# Patient Record
Sex: Female | Born: 1990 | Race: Black or African American | Hispanic: No | Marital: Single | Smoking: Never smoker
Health system: Southern US, Community
[De-identification: ages and names within clinical notes are randomized; demographics above are authoritative.]

## PROBLEM LIST (undated history)

## (undated) ENCOUNTER — Inpatient Hospital Stay (HOSPITAL_COMMUNITY): Payer: Self-pay

## (undated) ENCOUNTER — Emergency Department (HOSPITAL_COMMUNITY): Admission: EM | Payer: Medicaid Other | Source: Home / Self Care

## (undated) ENCOUNTER — Emergency Department (HOSPITAL_COMMUNITY): Admission: EM | Discharge status: Absent without leave | Payer: Medicaid Other

## (undated) DIAGNOSIS — Z789 Other specified health status: Secondary | ICD-10-CM

## (undated) HISTORY — DX: Other specified health status: Z78.9

---

## 2017-04-30 NOTE — L&D Delivery Note (Signed)
Patient is a 27 y.o. now G2P2 s/p NSVD at 288w1d, who was admitted for SOL, TOLAC.  She progressed without augmentation to complete and pushed 33 minutes to deliver.  Cord clamping delayed by several minutes then clamped by CNM and cut by Mother of patient.  Placenta intact and spontaneous, bleeding minimal.  Right sulcus laceration repaired without difficulty.  Mom and baby stable prior to transfer to postpartum. She plans on breastfeeding. She is unsure for birth control.  Delivery Note At 9:12 AM a viable and healthy female was delivered via VBAC, Spontaneous (Presentation:ROA ).  APGAR: 9, 9; weight 5 lb 1 oz (2296 g).   Placenta intact and spontaneous, bleeding minimal. 3V Cord:  with no delivery complications.  Anesthesia: Epidural Episiotomy: None Lacerations: Sulcus Suture Repair: 3.0 vicryl Est. Blood Loss (mL): 150  Mom to postpartum.  Baby to Couplet care / Skin to Skin.  Sharyon CableVeronica C Nyeshia Mysliwiec CNM 08/22/2017, 10:51 AM

## 2017-05-08 LAB — CYTOLOGY - PAP: PAP SMEAR: NEGATIVE

## 2017-05-08 LAB — OB RESULTS CONSOLE RPR: RPR: NONREACTIVE

## 2017-05-08 LAB — OB RESULTS CONSOLE PLATELET COUNT: Platelets: 327

## 2017-05-08 LAB — OB RESULTS CONSOLE HGB/HCT, BLOOD
HCT: 26
HEMOGLOBIN: 8.3

## 2017-05-08 LAB — OB RESULTS CONSOLE HIV ANTIBODY (ROUTINE TESTING): HIV: NONREACTIVE

## 2017-05-08 LAB — OB RESULTS CONSOLE HEPATITIS B SURFACE ANTIGEN: Hepatitis B Surface Ag: NEGATIVE

## 2017-06-23 ENCOUNTER — Encounter (HOSPITAL_COMMUNITY): Payer: Self-pay | Admitting: *Deleted

## 2017-06-23 ENCOUNTER — Other Ambulatory Visit: Payer: Self-pay

## 2017-06-23 ENCOUNTER — Inpatient Hospital Stay (HOSPITAL_COMMUNITY)
Admission: AD | Admit: 2017-06-23 | Discharge: 2017-06-23 | Disposition: A | Discharge status: Home / Self Care | Payer: Self-pay | Source: Ambulatory Visit | Attending: Obstetrics and Gynecology | Admitting: Obstetrics and Gynecology

## 2017-06-23 DIAGNOSIS — Z3A28 28 weeks gestation of pregnancy: Secondary | ICD-10-CM | POA: Insufficient documentation

## 2017-06-23 DIAGNOSIS — M549 Dorsalgia, unspecified: Secondary | ICD-10-CM | POA: Insufficient documentation

## 2017-06-23 DIAGNOSIS — O26893 Other specified pregnancy related conditions, third trimester: Secondary | ICD-10-CM | POA: Insufficient documentation

## 2017-06-23 DIAGNOSIS — R109 Unspecified abdominal pain: Secondary | ICD-10-CM

## 2017-06-23 LAB — RAPID URINE DRUG SCREEN, HOSP PERFORMED
AMPHETAMINES: NOT DETECTED
Barbiturates: NOT DETECTED
Benzodiazepines: NOT DETECTED
Cocaine: NOT DETECTED
OPIATES: NOT DETECTED
Tetrahydrocannabinol: NOT DETECTED

## 2017-06-23 LAB — URINALYSIS, ROUTINE W REFLEX MICROSCOPIC
BILIRUBIN URINE: NEGATIVE
GLUCOSE, UA: NEGATIVE mg/dL
HGB URINE DIPSTICK: NEGATIVE
Ketones, ur: NEGATIVE mg/dL
Nitrite: NEGATIVE
PROTEIN: NEGATIVE mg/dL
Specific Gravity, Urine: 1.009 (ref 1.005–1.030)
pH: 6 (ref 5.0–8.0)

## 2017-06-23 LAB — CBC
HEMATOCRIT: 23.4 % — AB (ref 36.0–46.0)
Hemoglobin: 7.4 g/dL — ABNORMAL LOW (ref 12.0–15.0)
MCH: 21.8 pg — ABNORMAL LOW (ref 26.0–34.0)
MCHC: 31.6 g/dL (ref 30.0–36.0)
MCV: 68.8 fL — AB (ref 78.0–100.0)
PLATELETS: 238 10*3/uL (ref 150–400)
RBC: 3.4 MIL/uL — ABNORMAL LOW (ref 3.87–5.11)
RDW: 17.2 % — AB (ref 11.5–15.5)
WBC: 6.2 10*3/uL (ref 4.0–10.5)

## 2017-06-23 LAB — TYPE AND SCREEN
ABO/RH(D): A POS
Antibody Screen: NEGATIVE

## 2017-06-23 LAB — DIFFERENTIAL
BASOS ABS: 0 10*3/uL (ref 0.0–0.1)
BLASTS: 0 %
Band Neutrophils: 0 %
Basophils Relative: 0 %
Eosinophils Absolute: 0.1 10*3/uL (ref 0.0–0.7)
Eosinophils Relative: 1 %
LYMPHS ABS: 1.7 10*3/uL (ref 0.7–4.0)
LYMPHS PCT: 28 %
MONO ABS: 0.4 10*3/uL (ref 0.1–1.0)
MONOS PCT: 7 %
Metamyelocytes Relative: 0 %
Myelocytes: 0 %
NRBC: 0 /100{WBCs}
Neutro Abs: 4 10*3/uL (ref 1.7–7.7)
Neutrophils Relative %: 64 %
Other: 0 %
PROMYELOCYTES ABS: 0 %

## 2017-06-23 LAB — ABO/RH: ABO/RH(D): A POS

## 2017-06-23 LAB — WET PREP, GENITAL
CLUE CELLS WET PREP: NONE SEEN
SPERM: NONE SEEN
TRICH WET PREP: NONE SEEN
YEAST WET PREP: NONE SEEN

## 2017-06-23 NOTE — MAU Note (Addendum)
Pt presents with c/o lower abdominal cramping and back pain and pressure in her buttocks.  Reports symptoms began 2 hours ago.  Denies VB or LOF.  Reports +FM. Reports recently moved from [place] last seen 3 weeks ago.  No copies of prenatal records.

## 2017-06-23 NOTE — Discharge Instructions (Signed)

## 2017-06-23 NOTE — MAU Note (Signed)
Consent faxed to [facility] in [place], [place] for [place] prenatal records.

## 2017-06-23 NOTE — MAU Provider Note (Signed)
History   G2P1001 @ 28.4 by us from care in [place]. Prev c/s x 1 started having sharp pain 2 hours ago and came in to be evaluated. [place] Denies ROM or vag bleeding. CSN: [id number]  Arrival date & time 06/23/17  1724   None     Chief Complaint  Patient presents with  . Back Pain  . Cramping  . Pressure    HPI  No past medical history on file.   No family history on file.  Social History   Tobacco Use  . Smoking status: Not on file  Substance Use Topics  . Alcohol use: Not on file  . Drug use: Not on file    OB History    Gravida Para Term Preterm AB Living   2 1 1     1    SAB TAB Ectopic Multiple Live Births           1      Review of Systems  Constitutional: Negative.   HENT: Negative.   Eyes: Negative.   Respiratory: Negative.   Cardiovascular: Negative.   Gastrointestinal: Positive for abdominal pain.  Endocrine: Negative.   Genitourinary: Negative.   Musculoskeletal: Negative.   Allergic/Immunologic: Negative.   Neurological: Negative.   Hematological: Negative.   Psychiatric/Behavioral: Negative.     Allergies  Patient has no allergy information on record.  Home Medications    BP 120/68 (BP Location: Right Arm)   Pulse 86   Temp 98.6 F (37 C) (Oral)   Resp 20   Ht 5\' 5"  (1.651 m)   Wt 149 lb 12 oz (67.9 kg)   SpO2 100%   BMI 24.92 kg/m   Physical Exam  Constitutional: She is oriented to person, place, and time. She appears well-developed and well-nourished.  HENT:  Head: Normocephalic.  Eyes: Pupils are equal, round, and reactive to light.  Neck: Normal range of motion.  Cardiovascular: Normal rate, regular rhythm, normal heart sounds and intact distal pulses.  Pulmonary/Chest: Effort normal and breath sounds normal.  Abdominal: Soft. Bowel sounds are normal.  Genitourinary: Vagina normal and uterus normal.  Musculoskeletal: Normal range of motion.  Neurological: She is alert and oriented to person,  place, and time. She has normal reflexes.  Skin: Skin is warm and dry.  Psychiatric: She has a normal mood and affect. Her behavior is normal. Judgment and thought content normal.    MAU Course  Procedures (including critical care time)  Labs Reviewed  URINALYSIS, ROUTINE W REFLEX MICROSCOPIC  RAPID URINE DRUG SCREEN, HOSP PERFORMED   No results found.   No diagnosis found.    MDM  VSS, FHR pattern reassuring. SVE cl/th/post/high. No uc's. Info given to pt to [facility] Prenatal panel and cultures drawn. messeage sent to adm pool to [facility]. Wet prep neg. U/a neg will d/c home

## 2017-06-24 LAB — HEPATITIS B SURFACE ANTIGEN: HEP B S AG: NEGATIVE

## 2017-06-24 LAB — HIV ANTIBODY (ROUTINE TESTING W REFLEX): HIV Screen 4th Generation wRfx: NONREACTIVE

## 2017-06-24 LAB — GC/CHLAMYDIA PROBE AMP (~~LOC~~) NOT AT ARMC
CHLAMYDIA, DNA PROBE: NEGATIVE
NEISSERIA GONORRHEA: NEGATIVE

## 2017-06-24 LAB — RUBELLA SCREEN: RUBELLA: 16.9 {index} (ref >0.99)

## 2017-06-24 LAB — RPR: RPR: NONREACTIVE

## 2017-06-26 ENCOUNTER — Telehealth: Payer: Self-pay | Admitting: General Practice

## 2017-06-26 ENCOUNTER — Encounter: Payer: Self-pay | Admitting: General Practice

## 2017-06-26 NOTE — Telephone Encounter (Signed)
Called patient to inform her of New OB appointment on 07/10/17 at 1:35pm.  Unable to reach patient via phone.  Letter has been mailed to patient.

## 2017-07-10 ENCOUNTER — Ambulatory Visit (INDEPENDENT_AMBULATORY_CARE_PROVIDER_SITE_OTHER): Payer: Self-pay | Admitting: Obstetrics and Gynecology

## 2017-07-10 ENCOUNTER — Encounter: Payer: Self-pay | Admitting: Obstetrics and Gynecology

## 2017-07-10 ENCOUNTER — Telehealth: Payer: Self-pay

## 2017-07-10 DIAGNOSIS — Z98891 History of uterine scar from previous surgery: Secondary | ICD-10-CM | POA: Insufficient documentation

## 2017-07-10 DIAGNOSIS — Z3483 Encounter for supervision of other normal pregnancy, third trimester: Secondary | ICD-10-CM

## 2017-07-10 DIAGNOSIS — Z3480 Encounter for supervision of other normal pregnancy, unspecified trimester: Secondary | ICD-10-CM | POA: Insufficient documentation

## 2017-07-10 NOTE — Patient Instructions (Signed)
Vaginal Birth After Cesarean Delivery Vaginal birth after cesarean delivery (VBAC) is giving birth vaginally after previously delivering a baby by a cesarean. In the past, if a woman had a cesarean delivery, all births afterward would be done by cesarean delivery. This is no longer true. It can be safe for the mother to try a vaginal delivery after having a cesarean delivery. It is important to discuss VBAC with your health care provider early in the pregnancy so you can understand the risks, benefits, and options. It will give you time to decide what is best in your particular case. The final decision about whether to have a VBAC or repeat cesarean delivery should be between you and your health care provider. Any changes in your health or your baby's health during your pregnancy may make it necessary to change your initial decision about VBAC. Women who plan to have a VBAC should check with their health care provider to be sure that:  The previous cesarean delivery was done with a low transverse uterine cut (incision) (not a vertical classical incision).  The birth canal is big enough for the baby.  There were no other operations on the uterus.  An electronic fetal monitor (EFM) will be on at all times during labor.  An operating room will be available and ready in case an emergency cesarean delivery is needed.  A health care provider and surgical nursing staff will be available at all times during labor to be ready to do an emergency delivery cesarean if necessary.  An anesthesiologist will be present in case an emergency cesarean delivery is needed.  The nursery is prepared and has adequate personnel and necessary equipment available to care for the baby in case of an emergency cesarean delivery. Benefits of VBAC  Shorter stay in the hospital.  Avoidance of risks associated with cesarean delivery, such as: ? Surgical complications, such as opening of the incision or hernia in the  incision. ? Injury to other organs. ? Fever. This can occur if an infection develops after surgery. It can also occur as a reaction to the medicine given to make you numb during the surgery.  Less blood loss and need for blood transfusions.  Lower risk of blood clots and infection.  Shorter recovery.  Decreased risk for having to remove the uterus (hysterectomy).  Decreased risk for the placenta to completely or partially cover the opening of the uterus (placenta previa) with a future pregnancy.  Decrease risk in future labor and delivery. Risks of a VBAC  Tearing (rupture) of the uterus. This is occurs in less than 1% of VBACs. The risk of this happening is higher if: ? Steps are taken to begin the labor process (induce labor) or stimulate or strengthen contractions (augment labor). ? Medicine is used to soften (ripen) the cervix.  Having to remove the uterus (hysterectomy) if it ruptures. VBAC should not be done if:  The previous cesarean delivery was done with a vertical (classical) or T-shaped incision or you do not know what kind of incision was made.  You had a ruptured uterus.  You have had certain types of surgery on your uterus, such as removal of uterine fibroids. Ask your health care provider about other types of surgeries that prevent you from having a VBAC.  You have certain medical or childbirth (obstetrical) problems.  There are problems with the baby.  You have had two previous cesarean deliveries and no vaginal deliveries. Other facts to know about VBAC:  It   is safe to have an epidural anesthetic with VBAC.  It is safe to turn the baby from a breech position (attempt an external cephalic version).  It is safe to try a VBAC with twins.  VBAC may not be successful if your baby weights 8.8 lb (4 kg) or more. However, weight predictions are not always accurate and should not be used alone to decide if VBAC is right for you.  There is an increased failure rate  if the time between the cesarean delivery and VBAC is less than 19 months.  Your health care provider may advise against a VBAC if you have preeclampsia (high blood pressure, protein in the urine, and swelling of face and extremities).  VBAC is often successful if you previously gave birth vaginally.  VBAC is often successful when the labor starts spontaneously before the due date.  Delivering a baby through a VBAC is similar to having a normal spontaneous vaginal delivery. This information is not intended to replace advice given to you by your health care provider. Make sure you discuss any questions you have with your health care provider. Document Released: 10/07/2006 Document Revised: 09/22/2015 Document Reviewed: 11/13/2012 Elsevier Interactive Patient Education  2018 Elsevier Inc.  

## 2017-07-10 NOTE — Telephone Encounter (Signed)
Scheduled OB US for 07/17/17 at 07:45. Called patient at home and mobilr.  Unable to leave message.

## 2017-07-10 NOTE — Progress Notes (Signed)
New OB Note  07/10/2017   CC:  Chief Complaint  Patient presents with  . Initial Prenatal Visit    Transfer of Care Patient: yes - care [facility] in [place], [place] (only had 2 prenatal visits)  History of Present Illness: April Schmidt is a 27 y.o. G2P1001 at 4081w0d by anatomy ultrasound being seen today for her first obstetrical visit. Her obstetrical history is significant for none. This was an unplanned pregnancy. Patient does intend to breast feed. Patient is undecided for contraception after completion of pregnancy. Pregnancy history fully reviewed.  Her periods were: regular periods. LMP unknown.  She was using no method when she conceived. Last was on Depo as a teenager.  She has Negative signs or symptoms of nausea/vomiting of pregnancy. She has Negative signs or symptoms of miscarriage or preterm labor  Patient reports backache. No other concerns.   Any prior children are healthy, doing well, without any problems or issues: yes Complications in prior pregnancies: none Complications in prior deliveries: yes, c-section due to breech presentation  ROS: A 12-point review of systems was performed and negative, except as stated in the above HPI.   OBGYN History: OB History  Gravida Para Term Preterm AB Living  2 1 1     1   SAB TAB Ectopic Multiple Live Births          1    # Outcome Date [place] Lbr Len/2nd Weight Sex Delivery Anes PTL Lv  2 Current           1 Term     M CS-Unspec   LIV      GYN Hx: had pap early on this pregnancy - states it was normal  Past Medical History: Past Medical History:  Diagnosis Date  . Medical history non-contributory     Past Surgical History: Past Surgical History:  Procedure Laterality Date  . CESAREAN SECTION     breech presentation    Family History:  Family History  Problem Relation Age of Onset  . Diabetes Maternal Grandmother   . Diabetes Paternal Grandmother     Social History:  Social History    Socioeconomic History  . Marital status: Single    Spouse name: Not on file  . Number of children: Not on file  . Years of education: Not on file  . Highest education level: Not on file  Social Needs  . Financial resource strain: Not on file  . Food insecurity - worry: Not on file  . Food insecurity - inability: Not on file  . Transportation needs - medical: Not on file  . Transportation needs - non-medical: Not on file  Occupational History  . Not on file  Tobacco Use  . Smoking status: Never Smoker  . Smokeless tobacco: Never Used  Substance and Sexual Activity  . Alcohol use: No    Frequency: Never  . Drug use: No  . Sexual activity: No    Birth control/protection: None  Other Topics Concern  . Not on file  Social History Narrative  . Not on file    Allergy: Not on File  Current Outpatient Medications: No current outpatient medications on file.  Physical Exam:   BP 115/65   Pulse 71   Wt 68.4 kg (150 lb 14.4 oz)   LMP  (LMP Unknown)   BMI 25.11 kg/m  Body mass index is 25.11 kg/m. Contractions: Irritability Vag. Bleeding: None. Fundal height: 33 cm FHTs: 140  General  appearance: Well nourished, well developed female in no acute distress.  Neck:  Supple, normal appearance, and no thyromegaly  Cardiovascular: regular rate and rhythm Respiratory:  Clear to auscultation bilateral. Normal respiratory effort Abdomen: positive bowel sounds and no masses, hernias; diffusely non tender to palpation, non distended Breasts: not examined. Neuro/Psych:  Normal mood and affect. Alert Skin:  Warm and dry.  Lymphatic:  No inguinal lymphadenopathy.    Assessment/Plan: G2P1001 [redacted]w[redacted]d  1. Supervision of other normal pregnancy, antepartum -Doing well; continue routine care -records requested for c-section op note and prenatal records -had pap smear in [place] -Unsure if wants to [facility] or [facility]; reassess at next visit. Needs to sign TOLAC consent at next visit;  information to review given -schedule 2 hour GTT this week  Initial labs from MAU reviewed. No additional labs needed at this time.  Continue prenatal vitamins. Genetic Screening discussed, too late for genetic screening.  Ultrasound discussed; fetal anatomic survey: ordered. Will request piror records of [place] as well.  Problem list reviewed and updated. The nature of [facility] with multiple MDs and other Advanced Practice Providers was explained to patient; also emphasized that residents, students are part of our team. Routine obstetric precautions reviewed. Return in about 2 weeks (around 07/24/2017) for ob visit.    Caryl Ada, DO OB Fellow Center for [facility], [facility]

## 2017-07-16 ENCOUNTER — Other Ambulatory Visit: Payer: Self-pay

## 2017-07-17 ENCOUNTER — Ambulatory Visit (HOSPITAL_COMMUNITY)
Admission: RE | Admit: 2017-07-17 | Discharge: 2017-07-17 | Disposition: A | Discharge status: Home / Self Care | Payer: Self-pay | Source: Ambulatory Visit | Attending: Obstetrics and Gynecology | Admitting: Obstetrics and Gynecology

## 2017-07-17 DIAGNOSIS — Z3483 Encounter for supervision of other normal pregnancy, third trimester: Secondary | ICD-10-CM | POA: Insufficient documentation

## 2017-07-17 DIAGNOSIS — Z3A32 32 weeks gestation of pregnancy: Secondary | ICD-10-CM | POA: Insufficient documentation

## 2017-07-17 DIAGNOSIS — Z3480 Encounter for supervision of other normal pregnancy, unspecified trimester: Secondary | ICD-10-CM

## 2017-07-18 NOTE — Telephone Encounter (Signed)
Patient went to her ultrasound appointment on 07-17-17

## 2017-07-22 ENCOUNTER — Other Ambulatory Visit: Payer: Self-pay

## 2017-07-26 ENCOUNTER — Ambulatory Visit (INDEPENDENT_AMBULATORY_CARE_PROVIDER_SITE_OTHER): Payer: Self-pay | Admitting: Nurse Practitioner

## 2017-07-26 VITALS — BP 128/68 | HR 78 | Wt 155.1 lb

## 2017-07-26 DIAGNOSIS — Z98891 History of uterine scar from previous surgery: Secondary | ICD-10-CM

## 2017-07-26 DIAGNOSIS — R102 Pelvic and perineal pain: Secondary | ICD-10-CM

## 2017-07-26 DIAGNOSIS — R519 Headache, unspecified: Secondary | ICD-10-CM

## 2017-07-26 DIAGNOSIS — O26843 Uterine size-date discrepancy, third trimester: Secondary | ICD-10-CM | POA: Insufficient documentation

## 2017-07-26 DIAGNOSIS — O26893 Other specified pregnancy related conditions, third trimester: Secondary | ICD-10-CM

## 2017-07-26 DIAGNOSIS — Z3480 Encounter for supervision of other normal pregnancy, unspecified trimester: Secondary | ICD-10-CM

## 2017-07-26 DIAGNOSIS — R51 Headache: Secondary | ICD-10-CM

## 2017-07-26 NOTE — Progress Notes (Signed)
OB US scheduled for April 11 @ 1345.  Pt notified.

## 2017-07-26 NOTE — Patient Instructions (Addendum)
Use a pregnancy support belt to help relieve the pubic symphysis pain.  Braxton Hicks Contractions Contractions of the uterus can occur throughout pregnancy, but they are not always a sign that you are in labor. You may have practice contractions called Braxton Hicks contractions. These false labor contractions are sometimes confused with true labor. What are April PeltonBraxton Hicks contractions? Braxton Hicks contractions are tightening movements that occur in the muscles of the uterus before labor. Unlike true labor contractions, these contractions do not result in opening (dilation) and thinning of the cervix. Toward the end of pregnancy (32-34 weeks), Braxton Hicks contractions can happen more often and may become stronger. These contractions are sometimes difficult to tell apart from true labor because they can be very uncomfortable. You should not feel embarrassed if you go to the hospital with false labor. Sometimes, the only way to tell if you are in true labor is for your health care provider to look for changes in the cervix. The health care provider will do a physical exam and may monitor your contractions. If you are not in true labor, the exam should show that your cervix is not dilating and your water has not broken. If there are other health problems associated with your pregnancy, it is completely safe for you to be sent home with false labor. You may continue to have Braxton Hicks contractions until you go into true labor. How to tell the difference between true labor and false labor True labor  Contractions last 30-70 seconds.  Contractions become very regular.  Discomfort is usually felt in the top of the uterus, and it spreads to the lower abdomen and low back.  Contractions do not go away with walking.  Contractions usually become more intense and increase in frequency.  The cervix dilates and gets thinner. False labor  Contractions are usually shorter and not as strong as true  labor contractions.  Contractions are usually irregular.  Contractions are often felt in the front of the lower abdomen and in the groin.  Contractions may go away when you walk around or change positions while lying down.  Contractions get weaker and are shorter-lasting as time goes on.  The cervix usually does not dilate or become thin. Follow these instructions at home:  Take over-the-counter and prescription medicines only as told by your health care provider.  Keep up with your usual exercises and follow other instructions from your health care provider.  Eat and drink lightly if you think you are going into labor.  If Braxton Hicks contractions are making you uncomfortable: ? Change your position from lying down or resting to walking, or change from walking to resting. ? Sit and rest in a tub of warm water. ? Drink enough fluid to keep your urine pale yellow. Dehydration may cause these contractions. ? Do slow and deep breathing several times an hour.  Keep all follow-up prenatal visits as told by your health care provider. This is important. Contact a health care provider if:  You have a fever.  You have continuous pain in your abdomen. Get help right away if:  Your contractions become stronger, more regular, and closer together.  You have fluid leaking or gushing from your vagina.  You pass blood-tinged mucus (bloody show).  You have bleeding from your vagina.  You have low back pain that you never had before.  You feel your baby's head pushing down and causing pelvic pressure.  Your baby is not moving inside you as much as  it used to. Summary  Contractions that occur before labor are called Braxton Hicks contractions, false labor, or practice contractions.  Braxton Hicks contractions are usually shorter, weaker, farther apart, and less regular than true labor contractions. True labor contractions usually become progressively stronger and regular and they  become more frequent.  Manage discomfort from [facility] contractions by changing position, resting in a warm bath, drinking plenty of water, or practicing deep breathing. This information is not intended to replace advice given to you by your health care provider. Make sure you discuss any questions you have with your health care provider. Document Released: 08/30/2016 Document Revised: 08/30/2016 Document Reviewed: 08/30/2016 Elsevier Interactive Patient Education  2018 [employer].

## 2017-07-26 NOTE — Progress Notes (Signed)
    Subjective:  April Schmidt is a 27 y.o. G2P1001 at 6281w2d being seen today for ongoing prenatal care.  She is currently monitored for the following issues for this low-risk pregnancy and has Supervision of other normal pregnancy, antepartum; History of C-section; Uterine size date discrepancy pregnancy, third trimester; and Frequent headaches on their problem list.  Patient reports headache, no contractions and pelvic pain with difficulty walking.  Contractions: Irritability. Vag. Bleeding: None.  Movement: Present. Denies leaking of fluid.   The following portions of the patient's history were reviewed and updated as appropriate: allergies, current medications, past family history, past medical history, past social history, past surgical history and problem list. Problem list updated.  Objective:   Vitals:   07/26/17 1349  BP: 128/68  Pulse: 78  Weight: 155 lb 1.6 oz (70.4 kg)    Fetal Status:     Movement: Present     General:  Alert, oriented and cooperative. Patient is in no acute distress.  Skin: Skin is warm and dry. No rash noted.   Cardiovascular: Normal heart rate noted  Respiratory: Normal respiratory effort, no problems with respiration noted  Abdomen: Soft, gravid, appropriate for gestational age. Pain/Pressure: Present   Has pubic symphysis discomfort.  Pelvic:  Cervical exam deferred        Extremities: Normal range of motion.  Edema: None  Mental Status: Normal mood and affect. Normal behavior. Normal judgment and thought content.   Urinalysis:    NA  Assessment and Plan:  Pregnancy: G2P1001 at 5081w2d  1. Supervision of other normal pregnancy, antepartum  - US MFM OB FOLLOW UP; Future  2. History of C-section Previous C/S for breech presentation Needs to discuss delivery plan  3. Uterine size date discrepancy pregnancy, third trimester Not measuring as well today.  Has an ultrasound scheduled as follow up.  Did not discuss US results with client but MFM  concerned re: short femur length and BPD and HC small but do not meet criteria for clinically significant microcephaly Watch fundal height carefully  4. Frequent headaches Taking tylenol for headaches appropriately,   5. Pelvic pain affecting pregnancy in third trimester, antepartum Pubic symphysis discomfort - expect this to continue and improve after delivery. Recommended pregnancy support belt  Preterm labor symptoms and general obstetric precautions including but not limited to vaginal bleeding, contractions, leaking of fluid and fetal movement were reviewed in detail with the patient. Please refer to After Visit Summary for other counseling recommendations.  Return in about 2 weeks (around 08/09/2017).  Form completed to delay training for work and vocation for 90 days.  Nolene BernheimERRI BURLESON, RN, MSN, NP-BC Nurse Practitioner, [facility] for [facility], [facility] 07/26/2017 2:18 PM

## 2017-07-29 ENCOUNTER — Encounter: Payer: Self-pay | Admitting: *Deleted

## 2017-07-31 ENCOUNTER — Encounter: Payer: Self-pay | Admitting: Obstetrics and Gynecology

## 2017-07-31 DIAGNOSIS — O99019 Anemia complicating pregnancy, unspecified trimester: Secondary | ICD-10-CM | POA: Insufficient documentation

## 2017-08-01 ENCOUNTER — Other Ambulatory Visit: Payer: Self-pay | Admitting: Nurse Practitioner

## 2017-08-01 NOTE — Progress Notes (Signed)
CBC from February reviewed.  Severe anemia in pregnancy.  Consult with Dr. Shawnie PonsPratt.  Ordered IV iron.  Will need preauthorization.  Form to the clinic and left for the nurse.  Please notify the client  Nolene BernheimERRI Adrienne Delay, RN, MSN, NP-BC Nurse Practitioner, [facility] for [facility], [facility] 08/01/2017 6:26 PM

## 2017-08-06 ENCOUNTER — Encounter: Payer: Self-pay | Admitting: *Deleted

## 2017-08-07 ENCOUNTER — Telehealth: Payer: Self-pay | Admitting: General Practice

## 2017-08-07 NOTE — Telephone Encounter (Signed)
[facility]18 @ 11am, patient will need to arrive at admitting. Called patient, no answer- unable to leave message, phone just rings then disconnects. Will get MFM to send patient downstairs tomorrow after ultrasound appt for results.

## 2017-08-07 NOTE — Telephone Encounter (Addendum)
-----   Message from Currie Pariserri L Burleson, NP sent at 08/01/2017  6:29 PM EDT -----  Placed an order in her chart for fereheme.  Leaving a signed preauthoization sheet beside the scanned box. Terri

## 2017-08-08 ENCOUNTER — Ambulatory Visit (INDEPENDENT_AMBULATORY_CARE_PROVIDER_SITE_OTHER): Payer: Medicaid Other | Admitting: *Deleted

## 2017-08-08 ENCOUNTER — Ambulatory Visit (HOSPITAL_COMMUNITY)
Admission: RE | Admit: 2017-08-08 | Discharge: 2017-08-08 | Disposition: A | Discharge status: Home / Self Care | Payer: Medicaid Other | Source: Ambulatory Visit | Attending: Nurse Practitioner | Admitting: Nurse Practitioner

## 2017-08-08 DIAGNOSIS — Z3480 Encounter for supervision of other normal pregnancy, unspecified trimester: Secondary | ICD-10-CM

## 2017-08-08 DIAGNOSIS — Z3A35 35 weeks gestation of pregnancy: Secondary | ICD-10-CM | POA: Diagnosis not present

## 2017-08-08 DIAGNOSIS — Z362 Encounter for other antenatal screening follow-up: Secondary | ICD-10-CM | POA: Diagnosis not present

## 2017-08-08 DIAGNOSIS — O0933 Supervision of pregnancy with insufficient antenatal care, third trimester: Secondary | ICD-10-CM | POA: Diagnosis not present

## 2017-08-08 DIAGNOSIS — Z712 Person consulting for explanation of examination or test findings: Secondary | ICD-10-CM

## 2017-08-09 ENCOUNTER — Ambulatory Visit (INDEPENDENT_AMBULATORY_CARE_PROVIDER_SITE_OTHER): Payer: Medicaid Other | Admitting: Nurse Practitioner

## 2017-08-09 VITALS — BP 121/73 | HR 73 | Wt 156.9 lb

## 2017-08-09 DIAGNOSIS — Z3480 Encounter for supervision of other normal pregnancy, unspecified trimester: Secondary | ICD-10-CM

## 2017-08-09 DIAGNOSIS — Z98891 History of uterine scar from previous surgery: Secondary | ICD-10-CM | POA: Diagnosis not present

## 2017-08-09 DIAGNOSIS — Z3483 Encounter for supervision of other normal pregnancy, third trimester: Secondary | ICD-10-CM | POA: Diagnosis not present

## 2017-08-09 NOTE — Progress Notes (Signed)
    Subjective:  April Schmidt is a 27 y.o. G2P1001 at 2443w2d being seen today for ongoing prenatal care.  She is currently monitored for the following issues for this low-risk pregnancy and has Supervision of other normal pregnancy, antepartum; History of C-section; Uterine size date discrepancy pregnancy, third trimester; Frequent headaches; and Anemia on their problem list.  Patient reports no complaints.  Contractions: Irregular. Vag. Bleeding: None.  Movement: Present. Denies leaking of fluid.   The following portions of the patient's history were reviewed and updated as appropriate: allergies, current medications, past family history, past medical history, past social history, past surgical history and problem list. Problem list updated.  Objective:   Vitals:   08/09/17 1514  BP: 121/73  Pulse: 73  Weight: 156 lb 14.4 oz (71.2 kg)    Fetal Status: Fetal Heart Rate (bpm): 142 Fundal Height: 35 cm Movement: Present     General:  Alert, oriented and cooperative. Patient is in no acute distress.  Skin: Skin is warm and dry. No rash noted.   Cardiovascular: Normal heart rate noted  Respiratory: Normal respiratory effort, no problems with respiration noted  Abdomen: Soft, gravid, appropriate for gestational age. Pain/Pressure: Present     Pelvic:  Cervical exam deferred        Extremities: Normal range of motion.  Edema: None  Mental Status: Normal mood and affect. Normal behavior. Normal judgment and thought content.   Urinalysis:      Assessment and Plan:  Pregnancy: G2P1001 at 343w2d  1. Supervision of other normal pregnancy, antepartum and Anemia Discussed contraception and client is unsure.  She was "very careful" for the past 3 years and did not use anything else.  Unsure if she wants another child but would like to wait a few years and decide.  Reviewed that IUD and Nexplanon are available for use.  She did not seem to be interested. Reviewed ultrasound and baby's growth seems  appropriate with no further concerns by MFM. Has appointment this week to get her first iron infusion - client states she will keep the appointment.  2. History of C-section Prenatal records received.  Previous C/S was due to breech position.  ROI for CS Op Notes sent to her previous hospital.  Had client sign VBAC consent also and will cancel if previous uterine incision is not compatible for VBAC.  Preterm labor symptoms and general obstetric precautions including but not limited to vaginal bleeding, contractions, leaking of fluid and fetal movement were reviewed in detail with the patient. Please refer to After Visit Summary for other counseling recommendations.  Return in about 1 week (around 08/16/2017).  Nolene BernheimERRI Tynetta Bachmann, RN, MSN, NP-BC Nurse Practitioner, [facility], [facility] 08/09/2017 4:02 PM

## 2017-08-12 ENCOUNTER — Encounter (HOSPITAL_COMMUNITY): Payer: Self-pay

## 2017-08-12 ENCOUNTER — Inpatient Hospital Stay (HOSPITAL_COMMUNITY)
Admission: AD | Admit: 2017-08-12 | Discharge: 2017-08-12 | Disposition: A | Discharge status: Home / Self Care | Payer: Medicaid Other | Source: Ambulatory Visit | Attending: Family Medicine | Admitting: Family Medicine

## 2017-08-12 DIAGNOSIS — R109 Unspecified abdominal pain: Secondary | ICD-10-CM | POA: Diagnosis present

## 2017-08-12 DIAGNOSIS — O4703 False labor before 37 completed weeks of gestation, third trimester: Secondary | ICD-10-CM

## 2017-08-12 DIAGNOSIS — Z9889 Other specified postprocedural states: Secondary | ICD-10-CM | POA: Diagnosis not present

## 2017-08-12 DIAGNOSIS — Z3A35 35 weeks gestation of pregnancy: Secondary | ICD-10-CM | POA: Insufficient documentation

## 2017-08-12 LAB — URINALYSIS, ROUTINE W REFLEX MICROSCOPIC
Bilirubin Urine: NEGATIVE
GLUCOSE, UA: NEGATIVE mg/dL
HGB URINE DIPSTICK: NEGATIVE
Ketones, ur: NEGATIVE mg/dL
Nitrite: NEGATIVE
PH: 6 (ref 5.0–8.0)
Protein, ur: NEGATIVE mg/dL
Specific Gravity, Urine: 1.009 (ref 1.005–1.030)

## 2017-08-12 LAB — POCT FERN TEST: POCT Fern Test: NEGATIVE

## 2017-08-12 NOTE — MAU Note (Signed)
Tightening and cramping for the past couple hours. Thinks it happens about every 10-15 min. Feels cramps in rectum also. No vaginal bleeding, no abnormal discharge, no LOF. +FM

## 2017-08-12 NOTE — MAU Provider Note (Signed)
History     CSN: [id number]  Arrival date and time: 08/12/17 1215   First Provider Initiated Contact with Patient 08/12/17 1310      Chief Complaint  Patient presents with  . Abdominal Pain  . Contractions   HPI April Schmidt is a 27 y.o. G2P1001 at 6433w5d who presents with contractions. Symptoms began about 2 hours PTA. Reports feeling abdominal pain and tightening every 15+ minutes. Rates pain 8/10 when they occur. Denies vaginal bleeding, dysuria, or LOF. Positive fetal movement. No recent intercourse.   OB History    Gravida  2   Para  1   Term  1   Preterm      AB      Living  1     SAB      TAB      Ectopic      Multiple      Live Births  1           Past Medical History:  Diagnosis Date  . Medical history non-contributory     Past Surgical History:  Procedure Laterality Date  . CESAREAN SECTION     breech presentation    Family History  Problem Relation Age of Onset  . Diabetes Maternal Grandmother   . Diabetes Paternal Grandmother     Social History   Tobacco Use  . Smoking status: Never Smoker  . Smokeless tobacco: Never Used  Substance Use Topics  . Alcohol use: No    Frequency: Never  . Drug use: No    Allergies: No Known Allergies  No medications prior to admission.    Review of Systems  Constitutional: Negative.   Gastrointestinal: Positive for abdominal pain.  Genitourinary: Negative.    Physical Exam   Blood pressure 120/71, pulse 85, temperature 98 F (36.7 C), temperature source Oral, resp. rate 18, height 5\' 5"  (1.651 m), weight 158 lb (71.7 kg).  Physical Exam  Nursing note and vitals reviewed. Constitutional: She is oriented to person, place, and time. She appears well-developed and well-nourished. No distress.  HENT:  Head: Normocephalic and atraumatic.  Eyes: Conjunctivae are normal. Right eye exhibits no discharge. Left eye exhibits no discharge. No scleral icterus.  Neck: Normal range of motion.   Respiratory: Effort normal. No respiratory distress.  GI: Soft. There is no tenderness.  Genitourinary:  Genitourinary Comments: No pooling  Neurological: She is alert and oriented to person, place, and time.  Skin: Skin is warm and dry. She is not diaphoretic.  Psychiatric: She has a normal mood and affect. Her behavior is normal. Judgment and thought content normal.    MAU Course  Procedures Results for orders placed or performed during the hospital encounter of 08/12/17 (from the past 24 hour(s))  Urinalysis, Routine w reflex microscopic     Status: Abnormal   Collection Time: 08/12/17 12:31 PM  Result Value Ref Range   Color, Urine YELLOW YELLOW   APPearance CLEAR CLEAR   Specific Gravity, Urine 1.009 1.005 - 1.030   pH 6.0 5.0 - 8.0   Glucose, UA NEGATIVE NEGATIVE mg/dL   Hgb urine dipstick NEGATIVE NEGATIVE   Bilirubin Urine NEGATIVE NEGATIVE   Ketones, ur NEGATIVE NEGATIVE mg/dL   Protein, ur NEGATIVE NEGATIVE mg/dL   Nitrite NEGATIVE NEGATIVE   Leukocytes, UA MODERATE (A) NEGATIVE   RBC / HPF 0-5 0 - 5 RBC/hpf   WBC, UA 0-5 0 - 5 WBC/hpf   Bacteria, UA RARE (A) NONE SEEN   Squamous  Epithelial / LPF 0-5 (A) NONE SEEN  POCT fern test     Status: None   Collection Time: 08/12/17  4:39 PM  Result Value Ref Range   POCT Fern Test Negative = intact amniotic membranes     MDM NST:  Baseline: 140 bpm, Variability: Good {> 6 bpm), Accelerations: Reactive, Decelerations: Variable: mild and ctx Q3-10 Cervix closed/posterior x 2 exams Some thin discharge during digital exam. SSE performed, no pooling and fern negative  Assessment and Plan  A: 1. Preterm uterine contractions in third trimester, antepartum   2. [redacted] weeks gestation of pregnancy    P: Discharge home Discussed reasons to return to MAU Keep f/u with OB  Judeth Horn 08/12/2017, 1:10 PM

## 2017-08-12 NOTE — Discharge Instructions (Signed)
Braxton Hicks Contractions °Contractions of the uterus can occur throughout pregnancy, but they are not always a sign that you are in labor. You may have practice contractions called Braxton Hicks contractions. These false labor contractions are sometimes confused with true labor. °What are Braxton Hicks contractions? °Braxton Hicks contractions are tightening movements that occur in the muscles of the uterus before labor. Unlike true labor contractions, these contractions do not result in opening (dilation) and thinning of the cervix. Toward the end of pregnancy (32-34 weeks), Braxton Hicks contractions can happen more often and may become stronger. These contractions are sometimes difficult to tell apart from true labor because they can be very uncomfortable. You should not feel embarrassed if you go to the hospital with false labor. °Sometimes, the only way to tell if you are in true labor is for your health care provider to look for changes in the cervix. The health care provider will do a physical exam and may monitor your contractions. If you are not in true labor, the exam should show that your cervix is not dilating and your water has not broken. °If there are other health problems associated with your pregnancy, it is completely safe for you to be sent home with false labor. You may continue to have Braxton Hicks contractions until you go into true labor. °How to tell the difference between true labor and false labor °True labor °· Contractions last 30-70 seconds. °· Contractions become very regular. °· Discomfort is usually felt in the top of the uterus, and it spreads to the lower abdomen and low back. °· Contractions do not go away with walking. °· Contractions usually become more intense and increase in frequency. °· The cervix dilates and gets thinner. °False labor °· Contractions are usually shorter and not as strong as true labor contractions. °· Contractions are usually irregular. °· Contractions  are often felt in the front of the lower abdomen and in the groin. °· Contractions may go away when you walk around or change positions while lying down. °· Contractions get weaker and are shorter-lasting as time goes on. °· The cervix usually does not dilate or become thin. °Follow these instructions at home: °· Take over-the-counter and prescription medicines only as told by your health care provider. °· Keep up with your usual exercises and follow other instructions from your health care provider. °· Eat and drink lightly if you think you are going into labor. °· If Braxton Hicks contractions are making you uncomfortable: °? Change your position from lying down or resting to walking, or change from walking to resting. °? Sit and rest in a tub of warm water. °? Drink enough fluid to keep your urine pale yellow. Dehydration may cause these contractions. °? Do slow and deep breathing several times an hour. °· Keep all follow-up prenatal visits as told by your health care provider. This is important. °Contact a health care provider if: °· You have a fever. °· You have continuous pain in your abdomen. °Get help right away if: °· Your contractions become stronger, more regular, and closer together. °· You have fluid leaking or gushing from your vagina. °· You pass blood-tinged mucus (bloody show). °· You have bleeding from your vagina. °· You have low back pain that you never had before. °· You feel your baby’s head pushing down and causing pelvic pressure. °· Your baby is not moving inside you as much as it used to. °Summary °· Contractions that occur before labor are called Braxton   Hicks contractions, false labor, or practice contractions. °· Braxton Hicks contractions are usually shorter, weaker, farther apart, and less regular than true labor contractions. True labor contractions usually become progressively stronger and regular and they become more frequent. °· Manage discomfort from Braxton Hicks contractions by  changing position, resting in a warm bath, drinking plenty of water, or practicing deep breathing. °This information is not intended to replace advice given to you by your health care provider. Make sure you discuss any questions you have with your health care provider. °Document Released: 08/30/2016 Document Revised: 08/30/2016 Document Reviewed: 08/30/2016 °Elsevier Interactive Patient Education © 2018 [employer] Inc[employer] ° °Fetal Movement Counts °Patient Name: ________________________________________________ Patient Due Date: ____________________ °What is a fetal movement count? °A fetal movement count is the number of times that you feel your baby move during a certain amount of time. This may also be called a fetal kick count. A fetal movement count is recommended for every pregnant woman. You may be asked to start counting fetal movements as early as week 28 of your pregnancy. °Pay attention to when your baby is most active. You may notice your baby's sleep and wake cycles. You may also notice things that make your baby move more. You should do a fetal movement count: °· When your baby is normally most active. °· At the same time each day. ° °A good time to count movements is while you are resting, after having something to eat and drink. °How do I count fetal movements? °1. Find a quiet, comfortable area. Sit, or lie down on your side. °2. Write down the date, the start time and stop time, and the number of movements that you felt between those two times. Take this information with you to your health care visits. °3. For 2 hours, count kicks, flutters, swishes, rolls, and jabs. You should feel at least 10 movements during 2 hours. °4. You may stop counting after you have felt 10 movements. °5. If you do not feel 10 movements in 2 hours, have something to eat and drink. Then, keep resting and counting for 1 hour. If you feel at least 4 movements during that hour, you may stop counting. °Contact a health care  provider if: °· You feel fewer than 4 movements in 2 hours. °· Your baby is not moving like he or she usually does. °Date: ____________ Start time: ____________ Stop time: ____________ Movements: ____________ °Date: ____________ Start time: ____________ Stop time: ____________ Movements: ____________ °Date: ____________ Start time: ____________ Stop time: ____________ Movements: ____________ °Date: ____________ Start time: ____________ Stop time: ____________ Movements: ____________ °Date: ____________ Start time: ____________ Stop time: ____________ Movements: ____________ °Date: ____________ Start time: ____________ Stop time: ____________ Movements: ____________ °Date: ____________ Start time: ____________ Stop time: ____________ Movements: ____________ °Date: ____________ Start time: ____________ Stop time: ____________ Movements: ____________ °Date: ____________ Start time: ____________ Stop time: ____________ Movements: ____________ °This information is not intended to replace advice given to you by your health care provider. Make sure you discuss any questions you have with your health care provider. °Document Released: 05/16/2006 Document Revised: 12/14/2015 Document Reviewed: 05/26/2015 °Elsevier Interactive Patient Education © 2018 Elsevier Inc. ° °

## 2017-08-14 ENCOUNTER — Ambulatory Visit (INDEPENDENT_AMBULATORY_CARE_PROVIDER_SITE_OTHER): Payer: Medicaid Other | Admitting: Family Medicine

## 2017-08-14 ENCOUNTER — Other Ambulatory Visit (HOSPITAL_COMMUNITY)
Admission: RE | Admit: 2017-08-14 | Discharge: 2017-08-14 | Disposition: A | Discharge status: Home / Self Care | Payer: Medicaid Other | Source: Ambulatory Visit | Attending: Family Medicine | Admitting: Family Medicine

## 2017-08-14 VITALS — BP 135/67 | HR 84 | Wt 159.4 lb

## 2017-08-14 DIAGNOSIS — Z98891 History of uterine scar from previous surgery: Secondary | ICD-10-CM | POA: Diagnosis not present

## 2017-08-14 DIAGNOSIS — Z3483 Encounter for supervision of other normal pregnancy, third trimester: Secondary | ICD-10-CM

## 2017-08-14 DIAGNOSIS — O99013 Anemia complicating pregnancy, third trimester: Secondary | ICD-10-CM

## 2017-08-14 DIAGNOSIS — O26843 Uterine size-date discrepancy, third trimester: Secondary | ICD-10-CM | POA: Diagnosis not present

## 2017-08-14 DIAGNOSIS — Z3480 Encounter for supervision of other normal pregnancy, unspecified trimester: Secondary | ICD-10-CM

## 2017-08-14 LAB — OB RESULTS CONSOLE GBS: STREP GROUP B AG: NEGATIVE

## 2017-08-14 LAB — OB RESULTS CONSOLE GC/CHLAMYDIA: GC PROBE AMP, GENITAL: NEGATIVE

## 2017-08-14 NOTE — Progress Notes (Signed)
   PRENATAL VISIT NOTE  Subjective:  April Schmidt is a [age] y.o. G2P1001 at 3328w0d being seen today for ongoing prenatal care.  She is currently monitored for the following issues for this low-risk pregnancy and has Supervision of other normal pregnancy, antepartum; History of C-section; Uterine size date discrepancy pregnancy, third trimester; Frequent headaches; and Anemia on their problem list.  Patient reports no complaints.  Contractions: Irregular. Vag. Bleeding: None.  Movement: Present. Denies leaking of fluid.   The following portions of the patient's history were reviewed and updated as appropriate: allergies, current medications, past family history, past medical history, past social history, past surgical history and problem list. Problem list updated.  Objective:   Vitals:   08/14/17 1629 08/14/17 1634  BP: (!) 145/68 135/67  Pulse: 84 84  Weight: 159 lb 6.4 oz (72.3 kg)     Fetal Status: Fetal Heart Rate (bpm): 154   Movement: Present     General:  Alert, oriented and cooperative. Patient is in no acute distress.  Skin: Skin is warm and dry. No rash noted.   Cardiovascular: Normal heart rate noted  Respiratory: Normal respiratory effort, no problems with respiration noted  Abdomen: Soft, gravid, appropriate for gestational age.  Pain/Pressure: Absent     Pelvic: Cervical exam deferred        Extremities: Normal range of motion.  Edema: None  Mental Status: Normal mood and affect. Normal behavior. Normal judgment and thought content.   Assessment and Plan:  Pregnancy: G2P1001 at 4428w0d  1. Supervision of other normal pregnancy, antepartum - GC/Chlamydia probe amp [facility] - Culture, beta strep (group b only)  2. Uterine size date discrepancy pregnancy, third trimester U/S 4/11 with appropriate growth  3. History of C-section VBAC consent signed last visit  4. Anemia affecting pregnancy Lab Results  Component Value Date   WBC 6.2 06/23/2017   HGB  7.4 (L) 06/23/2017   HCT 23.4 (L) 06/23/2017   MCV 68.8 (L) 06/23/2017   PLT 238 06/23/2017   - Pt getting iron infusion. Next appt tomrrow  Preterm labor symptoms and general obstetric precautions including but not limited to vaginal bleeding, contractions, leaking of fluid and fetal movement were reviewed in detail with the patient. Please refer to After Visit Summary for other counseling recommendations.  Return in about 1 week (around 08/21/2017).  Future Appointments  Date Time Provider Department Center  08/15/2017 11:00 AM [facility] ROOM 3 [facility] None  08/21/2017 10:15 AM Judeth HornLawrence, Erin, NP [facility]  08/22/2017 11:00 AM [facility] ROOM 11 [facility] None  08/28/2017 11:15 AM Jud[facility] HornLawrence, Erin, [facility]  09/04/2017 11:15 AM Marvetta GibbonsBurleson, Brand Maleserri L, NP [facility]  09/10/2017 11:15 AM Pincus LargePhelps, Jazma Y, DO [facility]    Frederik PearJulie P Susann Lawhorne, MD

## 2017-08-14 NOTE — Progress Notes (Signed)
C/o headache =3 earlier, none at present.

## 2017-08-15 ENCOUNTER — Ambulatory Visit (HOSPITAL_COMMUNITY)
Admission: RE | Admit: 2017-08-15 | Discharge: 2017-08-15 | Disposition: A | Discharge status: Home / Self Care | Payer: Medicaid Other | Source: Ambulatory Visit | Attending: Family Medicine | Admitting: Family Medicine

## 2017-08-15 DIAGNOSIS — O99013 Anemia complicating pregnancy, third trimester: Secondary | ICD-10-CM | POA: Diagnosis present

## 2017-08-15 DIAGNOSIS — Z3A37 37 weeks gestation of pregnancy: Secondary | ICD-10-CM | POA: Insufficient documentation

## 2017-08-15 MED ORDER — SODIUM CHLORIDE 0.9 % IV SOLN
510.0000 mg | INTRAVENOUS | Status: DC
  Administered 2017-08-15: 510 mg via INTRAVENOUS
  Filled 2017-08-15: qty 17

## 2017-08-15 NOTE — Discharge Instructions (Signed)

## 2017-08-16 LAB — GC/CHLAMYDIA PROBE AMP (~~LOC~~) NOT AT ARMC
CHLAMYDIA, DNA PROBE: NEGATIVE
NEISSERIA GONORRHEA: NEGATIVE

## 2017-08-19 LAB — CULTURE, BETA STREP (GROUP B ONLY): Strep Gp B Culture: NEGATIVE

## 2017-08-19 NOTE — Progress Notes (Signed)
At time of visit on 4/11, pt was informed of lab results showing low Hgb and need for IV iron infusion. Appt details provided to pt. She has scheduled prenatal visit appt on 4/12.

## 2017-08-21 ENCOUNTER — Inpatient Hospital Stay (HOSPITAL_COMMUNITY): Payer: Medicaid Other

## 2017-08-21 ENCOUNTER — Other Ambulatory Visit: Payer: Self-pay

## 2017-08-21 ENCOUNTER — Ambulatory Visit (INDEPENDENT_AMBULATORY_CARE_PROVIDER_SITE_OTHER): Payer: Medicaid Other | Admitting: Student

## 2017-08-21 ENCOUNTER — Encounter (HOSPITAL_COMMUNITY): Payer: Self-pay

## 2017-08-21 ENCOUNTER — Other Ambulatory Visit (HOSPITAL_COMMUNITY): Payer: Self-pay | Admitting: *Deleted

## 2017-08-21 ENCOUNTER — Inpatient Hospital Stay (HOSPITAL_COMMUNITY)
Admission: AD | Admit: 2017-08-21 | Discharge: 2017-08-24 | DRG: 806 | Disposition: A | Discharge status: Home / Self Care | Payer: Medicaid Other | Source: Ambulatory Visit | Attending: Obstetrics and Gynecology | Admitting: Obstetrics and Gynecology

## 2017-08-21 VITALS — BP 129/63 | HR 80 | Wt 159.0 lb

## 2017-08-21 DIAGNOSIS — Z3480 Encounter for supervision of other normal pregnancy, unspecified trimester: Secondary | ICD-10-CM

## 2017-08-21 DIAGNOSIS — O479 False labor, unspecified: Secondary | ICD-10-CM | POA: Diagnosis present

## 2017-08-21 DIAGNOSIS — O368390 Maternal care for abnormalities of the fetal heart rate or rhythm, unspecified trimester, not applicable or unspecified: Secondary | ICD-10-CM

## 2017-08-21 DIAGNOSIS — Z3483 Encounter for supervision of other normal pregnancy, third trimester: Secondary | ICD-10-CM | POA: Diagnosis not present

## 2017-08-21 DIAGNOSIS — D649 Anemia, unspecified: Secondary | ICD-10-CM | POA: Diagnosis present

## 2017-08-21 DIAGNOSIS — M6208 Separation of muscle (nontraumatic), other site: Secondary | ICD-10-CM | POA: Diagnosis present

## 2017-08-21 DIAGNOSIS — Z3A37 37 weeks gestation of pregnancy: Secondary | ICD-10-CM

## 2017-08-21 DIAGNOSIS — O34219 Maternal care for unspecified type scar from previous cesarean delivery: Principal | ICD-10-CM | POA: Diagnosis present

## 2017-08-21 DIAGNOSIS — K439 Ventral hernia without obstruction or gangrene: Secondary | ICD-10-CM | POA: Diagnosis present

## 2017-08-21 DIAGNOSIS — O9902 Anemia complicating childbirth: Secondary | ICD-10-CM | POA: Diagnosis present

## 2017-08-21 MED ORDER — HYDROMORPHONE HCL 1 MG/ML IJ SOLN
0.5000 mg | Freq: Once | INTRAMUSCULAR | Status: AC
Start: 2017-08-22 — End: 2017-08-22
  Administered 2017-08-22: 0.5 mg via INTRAMUSCULAR
  Filled 2017-08-21: qty 1

## 2017-08-21 MED ORDER — PRENATAL PLUS 27-1 MG PO TABS: 1.0000 | ORAL_TABLET | Freq: Every day | ORAL | 0 refills | Status: DC

## 2017-08-21 NOTE — Progress Notes (Signed)
   PRENATAL VISIT NOTE  Subjective:  April Schmidt is a 27 y.o. G2P1001 at 3623w0d being seen today for ongoing prenatal care.  She is currently monitored for the following issues for this low-risk pregnancy and has Supervision of other normal pregnancy, antepartum; History of C-section; Uterine size date discrepancy pregnancy, third trimester; Frequent headaches; and Anemia affecting pregnancy on their problem list.  Patient reports occasional contractions. Reports run of contractions that occur nightly and last for a few hours.  Contractions: Irregular. Vag. Bleeding: None.  Movement: Present. Denies leaking of fluid.   The following portions of the patient's history were reviewed and updated as appropriate: allergies, current medications, past family history, past medical history, past social history, past surgical history and problem list. Problem list updated.  Objective:   Vitals:   08/21/17 1008  BP: 129/63  Pulse: 80  Weight: 159 lb (72.1 kg)    Fetal Status: Fetal Heart Rate (bpm): 149 Fundal Height: 36 cm Movement: Present  Presentation: Vertex  General:  Alert, oriented and cooperative. Patient is in no acute distress.  Skin: Skin is warm and dry. No rash noted.   Cardiovascular: Normal heart rate noted  Respiratory: Normal respiratory effort, no problems with respiration noted  Abdomen: Soft, gravid, appropriate for gestational age.  Pain/Pressure: Present     Pelvic: Cervical exam performed Dilation: 1.5 Effacement (%): 60 Station: -3  Extremities: Normal range of motion.  Edema: Trace  Mental Status: Normal mood and affect. Normal behavior. Normal judgment and thought content.   Assessment and Plan:  Pregnancy: G2P1001 at 3523w0d  1. Supervision of other normal pregnancy, antepartum -discussed braxton hicks ctx vs labor; s/s of active labor & when to present to MAU - prenatal vitamin w/FE, FA (PRENATAL 1 + 1) 27-1 MG TABS tablet; Take 1 tablet by mouth daily at 12 noon.   Dispense: 30 each; Refill: 0   Term labor symptoms and general obstetric precautions including but not limited to vaginal bleeding, contractions, leaking of fluid and fetal movement were reviewed in detail with the patient. Please refer to After Visit Summary for other counseling recommendations.  Return in about 1 week (around 08/28/2017) for Routine OB.  Future Appointments  Date Time Provider Department Center  08/22/2017 11:00 AM [facility] ROOM 11 [facility] None  08/28/2017 11:15 AM Judeth HornLawrence, Rickayla Wieland, NP [facility]  09/04/2017 11:15 AM Marvetta GibbonsBurleson, Brand Maleserri L, NP [facility]  09/10/2017 11:15 AM Pincus LargePhelps, Jazma Y, DO [facility]    Judeth HornErin Karia Ehresman, NP

## 2017-08-21 NOTE — Patient Instructions (Signed)
Before Baby Comes Home  Before your baby arrives it is important to:   Have all of the supplies that you will need to care for your baby.   Know where to go if there is an emergency.   Discuss the baby's arrival with other family members.    What supplies will I need?    It is recommended that you have the following supplies:  Large Items   Crib.   Crib mattress.   Rear-facing infant car seat. If possible, have a trained professional check to make sure that it is installed correctly.    Feeding   6-8 bottles that are 4-5 oz in size.   6-8 nipples.   Bottle brush.   Sterilizer, or a large pan or kettle with a lid.   A way to boil and cool water.   If you will be breastfeeding:  ? Breast pump.  ? Nipple cream.  ? Nursing bra.  ? Breast pads.  ? Breast shields.   If you will be formula feeding:  ? Formula.  ? Measuring cups.  ? Measuring spoons.    Bathing   Mild baby soap and baby shampoo.   Petroleum jelly.   Soft cloth towel and washcloth.   Hooded towel.   Cotton balls.   Bath basin.    Other Supplies   Rectal thermometer.   Bulb syringe.   Baby wipes or washcloths for diaper changes.   Diaper bag.   Changing pad.   Clothing, including one-piece outfits and pajamas.   Baby nail clippers.   Receiving blankets.   Mattress pad and sheets for the crib.   Night-light for the baby's room.   Baby monitor.   2 or 3 pacifiers.   Either 24-36 cloth diapers and waterproof diaper covers or a box of disposable diapers. You may need to use as many as 10-12 diapers per day.    How do I prepare for an emergency?  Prepare for an emergency by:   Knowing how to get to the nearest hospital.   Listing the phone numbers of your baby's health care providers near your home phone and in your cell phone.    How do I prepare my family?   Decide how to handle visitors.   If you have other children:  ? Talk with them about the baby coming home. Ask them how they feel about it.  ? Read a book together about  being a new big brother or sister.  ? Find ways to let them help you prepare for the new baby.  ? Have someone ready to care for them while you are in the hospital.  This information is not intended to replace advice given to you by your health care provider. Make sure you discuss any questions you have with your health care provider.  Document Released: 03/29/2008 Document Revised: 09/22/2015 Document Reviewed: 03/24/2014  Elsevier Interactive Patient Education  2018 [employer]

## 2017-08-21 NOTE — MAU Provider Note (Signed)
Chief Complaint:  Contractions   First Provider Initiated Contact with Patient 08/21/17 2144     HPI: April Schmidt is a 27 y.o. G2P1001 at 8137w0dwho presents to maternity admissions reporting uterine contractions.  I was asked to see her to review FHR tracing which had some early/variable decels.  We observed her for labor and decels resolved.  Per Dr Despina HiddenEure, would not do BPP if we admit her. . She reports good fetal movement, denies LOF, vaginal bleeding, vaginal itching/burning, urinary symptoms, h/a, dizziness, n/v, diarrhea, constipation or fever/chills.   Her initial exam was remarkable for lax abdominal muscles and extreme tenderness to palpation of upper abdomen.  Dr Despina HiddenEure came and evaluated her and thought it to be related to a large diastasis recti.  He ordered an abdominal binder.  Her UCs have continued and she has made cervical change  Abdominal Pain  This is a new problem. The current episode started today. The problem occurs intermittently. The problem has been unchanged. The pain is located in the LLQ and RLQ. The pain is severe. The quality of the pain is cramping. The abdominal pain does not radiate. Pertinent negatives include no constipation, diarrhea, dysuria, fever, nausea or vomiting. The pain is aggravated by palpation. The pain is relieved by nothing. She has tried nothing for the symptoms. Her past medical history is significant for abdominal surgery (C/S).    RN Note Pt here with contractions. Denies bleeding or leaking. Was checked in the office today and was 1.5cm. Last delivery was C/S for breech; [facility]   Past Medical History: Past Medical History:  Diagnosis Date  . Medical history non-contributory     Past obstetric history: OB History  Gravida Para Term Preterm AB Living  2 1 1     1   SAB TAB Ectopic Multiple Live Births          1    # Outcome Date GA Lbr Len/2nd Weight Sex Delivery Anes PTL Lv  2 Current           1 Term     M CS-Unspec   LIV     Past Surgical History: Past Surgical History:  Procedure Laterality Date  . CESAREAN SECTION     breech presentation    Family History: Family History  Problem Relation Age of Onset  . Diabetes Maternal Grandmother   . Diabetes Paternal Grandmother     Social History: Social History   Tobacco Use  . Smoking status: Never Smoker  . Smokeless tobacco: Never Used  Substance Use Topics  . Alcohol use: No    Frequency: Never  . Drug use: No    Allergies: No Known Allergies  Meds:  Medications Prior to Admission  Medication Sig Dispense Refill Last Dose  . prenatal vitamin w/FE, FA (PRENATAL 1 + 1) 27-1 MG TABS tablet Take 1 tablet by mouth daily at 12 noon. 30 each 0     I have reviewed patient's Past Medical Hx, Surgical Hx, Family Hx, Social Hx, medications and allergies.   ROS:  Review of Systems  Constitutional: Negative for fever.  Gastrointestinal: Positive for abdominal pain. Negative for constipation, diarrhea, nausea and vomiting.  Genitourinary: Negative for dysuria.   Other systems negative  Physical Exam   Patient Vitals for the past 24 hrs:  BP Temp Temp src Pulse Resp SpO2 Height Weight  08/21/17 2055 136/73 - - 80 - - - -  08/21/17 2038 (!) 143/84 97.9 F (36.6 C)  Oral 75 16 100 % - -  08/21/17 2032 - - - - - - 5\' 5"  (1.651 m) 161 lb (73 kg)   Constitutional: Well-developed, well-nourished female in no acute distress.  Cardiovascular: normal rate and rhythm Respiratory: normal effort, clear to auscultation bilaterally GI: Abd soft, non-tender, gravid appropriate for gestational age.   No rebound or guarding. MS: Extremities nontender, no edema, normal ROM Neurologic: Alert and oriented x 4.  GU: Neg CVAT.  PELVIC EXAM:  Dilation: 2.5 Effacement (%): 90, 100 Station: -2 Presentation: Vertex Exam by:: Freddy Finner, RN   New exam  Dilation: 4 Effacement (%): 100 Station: -1 Presentation: Vertex Exam by:: Freddy Finner,  RN  FHT:  Baseline 135 , moderate variability, accelerations present, no decelerations Contractions: q 2-3 mins    Labs: No results found for this or any previous visit (from the past 24 hour(s)).   Imaging:    MAU Course/MDM: I have ordered labs for admission NST reviewed, reactive. Initially had a few variable decels which resolved Consult Dr Despina Hidden with presentation, exam findings and test results.  Treatments in MAU included EFM, Dilaudid 0.5mg  IM x 1 which relieved pain.    Assessment: 1. Variable fetal heart rate decelerations, antepartum   2.     Active Labor 3.     Diastasis Recti  Plan: Admit to BS Routine orders Labor team to follow  Wynelle Bourgeois CNM, MSN Certified Nurse-Midwife 08/21/2017 9:45 PM

## 2017-08-21 NOTE — MAU Note (Signed)
Pt here with contractions. Denies bleeding or leaking. Was checked in the office today and was 1.5cm. Last delivery was C[facility]S for breech; [facility]

## 2017-08-22 ENCOUNTER — Encounter (HOSPITAL_COMMUNITY): Payer: Self-pay | Admitting: *Deleted

## 2017-08-22 ENCOUNTER — Inpatient Hospital Stay (HOSPITAL_COMMUNITY): Payer: Medicaid Other | Admitting: Anesthesiology

## 2017-08-22 ENCOUNTER — Other Ambulatory Visit: Payer: Self-pay

## 2017-08-22 ENCOUNTER — Ambulatory Visit (HOSPITAL_COMMUNITY): Admission: RE | Admit: 2017-08-22 | Payer: Medicaid Other | Source: Ambulatory Visit

## 2017-08-22 DIAGNOSIS — M6208 Separation of muscle (nontraumatic), other site: Secondary | ICD-10-CM | POA: Diagnosis present

## 2017-08-22 DIAGNOSIS — Z3A37 37 weeks gestation of pregnancy: Secondary | ICD-10-CM

## 2017-08-22 DIAGNOSIS — Z3483 Encounter for supervision of other normal pregnancy, third trimester: Secondary | ICD-10-CM | POA: Diagnosis present

## 2017-08-22 DIAGNOSIS — O479 False labor, unspecified: Secondary | ICD-10-CM | POA: Diagnosis present

## 2017-08-22 DIAGNOSIS — K439 Ventral hernia without obstruction or gangrene: Secondary | ICD-10-CM | POA: Diagnosis present

## 2017-08-22 DIAGNOSIS — D649 Anemia, unspecified: Secondary | ICD-10-CM | POA: Diagnosis present

## 2017-08-22 DIAGNOSIS — O9902 Anemia complicating childbirth: Secondary | ICD-10-CM | POA: Diagnosis present

## 2017-08-22 DIAGNOSIS — O34219 Maternal care for unspecified type scar from previous cesarean delivery: Secondary | ICD-10-CM | POA: Diagnosis not present

## 2017-08-22 LAB — CBC
HCT: 27.9 % — ABNORMAL LOW (ref 36.0–46.0)
HEMOGLOBIN: 8.5 g/dL — AB (ref 12.0–15.0)
MCH: 21.6 pg — ABNORMAL LOW (ref 26.0–34.0)
MCHC: 30.5 g/dL (ref 30.0–36.0)
MCV: 70.8 fL — ABNORMAL LOW (ref 78.0–100.0)
PLATELETS: 248 10*3/uL (ref 150–400)
RBC: 3.94 MIL/uL (ref 3.87–5.11)
RDW: 24.5 % — ABNORMAL HIGH (ref 11.5–15.5)
WBC: 9.8 10*3/uL (ref 4.0–10.5)

## 2017-08-22 LAB — TYPE AND SCREEN
ABO/RH(D): A POS
ANTIBODY SCREEN: NEGATIVE

## 2017-08-22 LAB — RPR: RPR: NONREACTIVE

## 2017-08-22 LAB — HIV ANTIBODY (ROUTINE TESTING W REFLEX): HIV Screen 4th Generation wRfx: NONREACTIVE

## 2017-08-22 MED ORDER — BENZOCAINE-MENTHOL 20-0.5 % EX AERO
1.0000 "application " | INHALATION_SPRAY | CUTANEOUS | Status: DC | PRN
  Administered 2017-08-23: 1 via TOPICAL
  Filled 2017-08-22: qty 56

## 2017-08-22 MED ORDER — SENNOSIDES-DOCUSATE SODIUM 8.6-50 MG PO TABS
2.0000 | ORAL_TABLET | ORAL | Status: DC
Start: 2017-08-23 — End: 2017-08-24
  Administered 2017-08-23 (×2): 2 via ORAL
  Filled 2017-08-22 (×2): qty 2

## 2017-08-22 MED ORDER — DIPHENHYDRAMINE HCL 25 MG PO CAPS: 25.0000 mg | ORAL_CAPSULE | Freq: Four times a day (QID) | ORAL | Status: DC | PRN

## 2017-08-22 MED ORDER — LIDOCAINE HCL (PF) 1 % IJ SOLN
30.0000 mL | INTRAMUSCULAR | Status: DC | PRN
  Filled 2017-08-22: qty 30

## 2017-08-22 MED ORDER — WITCH HAZEL-GLYCERIN EX PADS: 1.0000 "application " | MEDICATED_PAD | CUTANEOUS | Status: DC | PRN

## 2017-08-22 MED ORDER — LACTATED RINGERS IV SOLN: 500.0000 mL | INTRAVENOUS | Status: DC | PRN

## 2017-08-22 MED ORDER — OXYCODONE-ACETAMINOPHEN 5-325 MG PO TABS: 2.0000 | ORAL_TABLET | ORAL | Status: DC | PRN

## 2017-08-22 MED ORDER — IBUPROFEN 600 MG PO TABS
600.0000 mg | ORAL_TABLET | Freq: Four times a day (QID) | ORAL | Status: DC
  Administered 2017-08-22 – 2017-08-24 (×10): 600 mg via ORAL
  Filled 2017-08-22 (×10): qty 1

## 2017-08-22 MED ORDER — LACTATED RINGERS IV SOLN: 500.0000 mL | Freq: Once | INTRAVENOUS | Status: DC

## 2017-08-22 MED ORDER — COCONUT OIL OIL
1.0000 | TOPICAL_OIL | Status: DC | PRN
Start: 2017-08-22 — End: 2017-08-24

## 2017-08-22 MED ORDER — EPHEDRINE 5 MG/ML INJ
10.0000 mg | INTRAVENOUS | Status: DC | PRN
  Filled 2017-08-22: qty 2

## 2017-08-22 MED ORDER — ACETAMINOPHEN 325 MG PO TABS: 650.0000 mg | ORAL_TABLET | ORAL | Status: DC | PRN

## 2017-08-22 MED ORDER — FENTANYL CITRATE (PF) 100 MCG/2ML IJ SOLN
100.0000 ug | INTRAMUSCULAR | Status: DC | PRN
  Administered 2017-08-22: 100 ug via INTRAVENOUS
  Filled 2017-08-22: qty 2

## 2017-08-22 MED ORDER — PRENATAL MULTIVITAMIN CH
1.0000 | ORAL_TABLET | Freq: Every day | ORAL | Status: DC
  Administered 2017-08-23 – 2017-08-24 (×2): 1 via ORAL
  Filled 2017-08-22 (×2): qty 1

## 2017-08-22 MED ORDER — OXYCODONE-ACETAMINOPHEN 5-325 MG PO TABS: 1.0000 | ORAL_TABLET | ORAL | Status: DC | PRN

## 2017-08-22 MED ORDER — SOD CITRATE-CITRIC ACID 500-334 MG/5ML PO SOLN: 30.0000 mL | ORAL | Status: DC | PRN

## 2017-08-22 MED ORDER — LIDOCAINE HCL (PF) 1 % IJ SOLN
INTRAMUSCULAR | Status: DC | PRN
  Administered 2017-08-22: 6 mL
  Administered 2017-08-22: 4 mL

## 2017-08-22 MED ORDER — LACTATED RINGERS IV SOLN
INTRAVENOUS | Status: DC
  Administered 2017-08-22 (×2): via INTRAVENOUS

## 2017-08-22 MED ORDER — ONDANSETRON HCL 4 MG/2ML IJ SOLN: 4.0000 mg | INTRAMUSCULAR | Status: DC | PRN

## 2017-08-22 MED ORDER — OXYTOCIN 40 UNITS IN LACTATED RINGERS INFUSION - SIMPLE MED
2.5000 [IU]/h | INTRAVENOUS | Status: DC
  Filled 2017-08-22: qty 1000

## 2017-08-22 MED ORDER — ONDANSETRON HCL 4 MG PO TABS: 4.0000 mg | ORAL_TABLET | ORAL | Status: DC | PRN

## 2017-08-22 MED ORDER — TETANUS-DIPHTH-ACELL PERTUSSIS 5-2.5-18.5 LF-MCG/0.5 IM SUSP: 0.5000 mL | Freq: Once | INTRAMUSCULAR | Status: DC

## 2017-08-22 MED ORDER — FENTANYL 2.5 MCG/ML BUPIVACAINE 1/10 % EPIDURAL INFUSION (WH - ANES)
14.0000 mL/h | INTRAMUSCULAR | Status: DC | PRN
  Administered 2017-08-22: 14 mL/h via EPIDURAL
  Filled 2017-08-22: qty 100

## 2017-08-22 MED ORDER — OXYTOCIN BOLUS FROM INFUSION
500.0000 mL | Freq: Once | INTRAVENOUS | Status: AC
  Administered 2017-08-22: 500 mL via INTRAVENOUS

## 2017-08-22 MED ORDER — PHENYLEPHRINE 40 MCG/ML (10ML) SYRINGE FOR IV PUSH (FOR BLOOD PRESSURE SUPPORT)
80.0000 ug | PREFILLED_SYRINGE | INTRAVENOUS | Status: DC | PRN
  Filled 2017-08-22: qty 5
  Filled 2017-08-22: qty 10

## 2017-08-22 MED ORDER — ZOLPIDEM TARTRATE 5 MG PO TABS: 5.0000 mg | ORAL_TABLET | Freq: Every evening | ORAL | Status: DC | PRN

## 2017-08-22 MED ORDER — DIBUCAINE 1 % RE OINT: 1.0000 "application " | TOPICAL_OINTMENT | RECTAL | Status: DC | PRN

## 2017-08-22 MED ORDER — DIPHENHYDRAMINE HCL 50 MG/ML IJ SOLN: 12.5000 mg | INTRAMUSCULAR | Status: DC | PRN

## 2017-08-22 MED ORDER — PHENYLEPHRINE 40 MCG/ML (10ML) SYRINGE FOR IV PUSH (FOR BLOOD PRESSURE SUPPORT)
80.0000 ug | PREFILLED_SYRINGE | INTRAVENOUS | Status: DC | PRN
  Filled 2017-08-22: qty 5

## 2017-08-22 MED ORDER — SIMETHICONE 80 MG PO CHEW
80.0000 mg | CHEWABLE_TABLET | ORAL | Status: DC | PRN
Start: 2017-08-22 — End: 2017-08-24

## 2017-08-22 MED ORDER — ONDANSETRON HCL 4 MG/2ML IJ SOLN
4.0000 mg | Freq: Four times a day (QID) | INTRAMUSCULAR | Status: DC | PRN
  Administered 2017-08-22: 4 mg via INTRAVENOUS
  Filled 2017-08-22: qty 2

## 2017-08-22 NOTE — Lactation Note (Addendum)
This note was copied from a baby's chart. Lactation Consultation Note  Patient Name: April Schmidt ZOXWR'UToday's Date: 08/22/2017 Reason for consult: Initial assessment;Early term 37-38.6wks;Infant < 6lbs  11 hours old early term < 6 pounds female who is being exclusively BF by his mother, she's a P2 and experienced BF, she was able to BF her first child for 12 months. RN set up mom with a DEPB due to difficult latch, and mom is already getting volume, she was able to pump nearly an ounce of colostrum from just one breast. Offered assistance with latch and took baby to the right breast STS in cross cradle position but baby wouldn't latch, he kept pushing away from the breast.  LC did some suck training and baby's pattern was very uncoordinated, it took at least 2 couple of minutes to get him into a rhythmical pattern before putting him back at the breast; he still wouldn't latch. Asked mom if she would like to try a NS and she voiced that those worked "wonderfully" with her first baby. Got mom sized on a # 20 NS and baby latched on right away but more suck training was required in order to elicit the sucking reflex. Baby sucked for about 5 minutes but no colostrum was seen on NS at the end of the feeding. Left a # 24 NS in case mom's nipples get bigger once lactogenesis II sets in, the # 20 seems to fit just right for now.  Advised mom to give baby some calories with her EBM and baby took 5 ml during consult with LC doing a combination of pace feeding and finger feeding and mom also doing pace feeding. Mom still has colostrum for the next feeding, recommended mom to keep pumping every 3 hours now that she's on a NS to protect her supply. She verbalized understanding. Discussed breastmilk storage, she'll start handing her EBM to her nurse if needed.  Encouraged mom to keep putting baby at the breast with a NS at least 8-12 times/24 hours or sooner if feeding cues are present. Mom will also feed baby her EBM  every 3 hours and will pump after feedings. Reviewed BF brochure, BF resources and feeding diary, mom is aware of LC services and will call PRN.  Maternal Data Formula Feeding for Exclusion: No Has patient been taught Hand Expression?: Yes Does the patient have breastfeeding experience prior to this delivery?: Yes  Feeding Feeding Type: Breast Fed Nipple Type: Slow - flow Length of feed: 5 min  LATCH Score Latch: Grasps breast easily, tongue down, lips flanged, rhythmical sucking.(with NS # 20)  Audible Swallowing: None  Type of Nipple: Everted at rest and after stimulation(short shafted)  Comfort (Breast/Nipple): Soft / non-tender  Hold (Positioning): Assistance needed to correctly position infant at breast and maintain latch.  LATCH Score: 7  Interventions Interventions: Breast feeding basics reviewed;Assisted with latch;Skin to skin;Breast massage;Hand express;Breast compression;Adjust position;Support pillows;Position options;Expressed milk;Hand pump;DEBP  Lactation Tools Discussed/Used Tools: Nipple Dorris CarnesShields;Pump Nipple shield size: 20 Breast pump type: Double-Electric Breast Pump;Manual WIC Program: No Pump Review: Setup, frequency, and cleaning;Milk Storage Initiated by:: RN Date initiated:: 08/22/17   Consult Status Consult Status: Follow-up Date: 08/23/17 Follow-up type: In-patient    Kearsten Ginther Venetia ConstableS Nupur Hohman 08/22/2017, 8:46 PM

## 2017-08-22 NOTE — Anesthesia Preprocedure Evaluation (Signed)
Anesthesia Evaluation  Patient identified by MRN, date of birth, ID band Patient awake    Reviewed: Allergy & Precautions, H&P , Patient's Chart, lab work & pertinent test results  Airway Mallampati: II  TM Distance: >3 FB Neck ROM: full    Dental  (+) Teeth Intact   Pulmonary    breath sounds clear to auscultation       Cardiovascular  Rhythm:regular Rate:Normal     Neuro/Psych    GI/Hepatic   Endo/Other    Renal/GU      Musculoskeletal   Abdominal   Peds  Hematology  (+) anemia ,   Anesthesia Other Findings       Reproductive/Obstetrics (+) Pregnancy                             Anesthesia Physical Anesthesia Plan  ASA: II  Anesthesia Plan: Epidural   Post-op Pain Management:    Induction:   PONV Risk Score and Plan:   Airway Management Planned:   Additional Equipment:   Intra-op Plan:   Post-operative Plan:   Informed Consent: I have reviewed the patients History and Physical, chart, labs and discussed the procedure including the risks, benefits and alternatives for the proposed anesthesia with the patient or authorized representative who has indicated his/her understanding and acceptance.   Dental Advisory Given  Plan Discussed with:   Anesthesia Plan Comments: (Labs checked- platelets confirmed with RN in room. Fetal heart tracing, per RN, reported to be stable enough for sitting procedure. Discussed epidural, and patient consents to the procedure:  included risk of possible headache,backache, failed block, allergic reaction, and nerve injury. This patient was asked if she had any questions or concerns before the procedure started.)        Anesthesia Quick Evaluation  

## 2017-08-22 NOTE — Anesthesia Procedure Notes (Signed)
Epidural Patient location during procedure: OB  Staffing Anesthesiologist: Erasmus Bistline, MD  Preanesthetic Checklist Completed: patient identified, pre-op evaluation, timeout performed, IV checked, risks and benefits discussed and monitors and equipment checked  Epidural Patient position: sitting Prep: DuraPrep Patient monitoring: blood pressure and continuous pulse ox Approach: right paramedian Location: L3-L4 Injection technique: LOR air  Needle:  Needle type: Tuohy  Needle gauge: 17 G Needle insertion depth: 5 cm Catheter type: closed end flexible Catheter size: 19 Gauge Catheter at skin depth: 10 cm Test dose: negative  Assessment Sensory level: T8  Additional Notes   Dosing of Epidural:  1st dose, through catheter .............................................  Xylocaine 40 mg  2nd dose, through catheter, after waiting 3 minutes.........Xylocaine 60 mg    As each dose occurred, patient was free of IV sx; and patient exhibited no evidence of SA injection.  Patient is more comfortable after epidural dosed. Please see RN's note for documentation of vital signs,and FHR which are stable.  Patient reminded not to try to ambulate with numb legs, and that an RN must be present when she attempts to get up.          

## 2017-08-22 NOTE — H&P (Addendum)
OBSTETRIC ADMISSION HISTORY AND PHYSICAL  April Schmidt is a 27 y.o. female G2P1001 with IUP at [redacted]w[redacted]d by clinical EDD presenting for labor. She reports +FMs, No LOF, no VB, no blurry vision, headaches or peripheral edema, and RUQ pain.  She plans on breast feeding. She is undecided about birth control. She received her prenatal care at [facility]   Dating: By clinical EDD --->  Estimated Date of Delivery: 09/11/17  Sono:    @[redacted]w[redacted]d , CWD, normal anatomy, cephalic presentation,  1689g, 37% EFW   Prenatal History/Complications:  Past Medical History: Past Medical History:  Diagnosis Date  . Medical history non-contributory     Past Surgical History: Past Surgical History:  Procedure Laterality Date  . CESAREAN SECTION     breech presentation    Obstetrical History: OB History    Gravida  2   Para  1   Term  1   Preterm      AB      Living  1     SAB      TAB      Ectopic      Multiple      Live Births  1           Social History: Social History   Socioeconomic History  . Marital status: Single    Spouse name: Not on file  . Number of children: Not on file  . Years of education: Not on file  . Highest education level: Not on file  Occupational History  . Not on file  Social Needs  . Financial resource strain: Not on file  . Food insecurity:    Worry: Not on file    Inability: Not on file  . Transportation needs:    Medical: Not on file    Non-medical: Not on file  Tobacco Use  . Smoking status: Never Smoker  . Smokeless tobacco: Never Used  Substance and Sexual Activity  . Alcohol use: No    Frequency: Never  . Drug use: No  . Sexual activity: Not Currently    Birth control/protection: None  Lifestyle  . Physical activity:    Days per week: Not on file    Minutes per session: Not on file  . Stress: Not on file  Relationships  . Social connections:    Talks on phone: Not on file    Gets together: Not on file    Attends religious  service: Not on file    Active member of club or organization: Not on file    Attends meetings of clubs or organizations: Not on file    Relationship status: Not on file  Other Topics Concern  . Not on file  Social History Narrative  . Not on file    Family History: Family History  Problem Relation Age of Onset  . Diabetes Maternal Grandmother   . Diabetes Paternal Grandmother     Allergies: No Known Allergies  Medications Prior to Admission  Medication Sig Dispense Refill Last Dose  . acetaminophen (TYLENOL) 500 MG tablet Take 500 mg by mouth every 6 (six) hours as needed for mild pain or headache.   Past Week at Unknown time  . prenatal vitamin w/FE, FA (PRENATAL 1 + 1) 27-1 MG TABS tablet Take 1 tablet by mouth daily at 12 noon. (Patient not taking: Reported on 08/21/2017) 30 each 0 Not Taking at Unknown time     Review of Systems   All systems reviewed and negative except  as stated in HPI  Blood pressure 138/84, pulse 78, temperature 98.4 F (36.9 C), temperature source Oral, resp. rate 18, height 5\' 5"  (1.651 m), weight 73 kg (161 lb), SpO2 100 %. General appearance: alert and cooperative Lungs: clear to auscultation bilaterally Heart: regular rate and rhythm Abdomen: soft, non-tender; bowel sounds normal Extremities: Homans sign is negative, no sign of DVT Presentation: cephalic Fetal monitoringBaseline: 140 bpm, Variability: Good {> 6 bpm), Accelerations: Non-reactive but appropriate for gestational age and Decelerations: Absent Uterine activityFrequency: Every 5 minutes Dilation: 4 Effacement (%): 100 Station: -1 Exam by:: Freddy FinnerAlex Gagliardo, RN   Prenatal labs: ABO, Rh: --/--/A POS (04/25 0115) Antibody: NEG (04/25 0115) Rubella: 16.90 (02/24 1828) RPR: Non Reactive (02/24 1828)  HBsAg: Negative (02/24 1828)  HIV: Non Reactive (02/24 1828)  GBS: Negative (04/17 0000)  1 hr Glucola normal Genetic screening  normla Anatomy US normla  Prenatal Transfer Tool   Maternal Diabetes: No Genetic Screening: Normal Maternal Ultrasounds/Referrals: Normal Fetal Ultrasounds or other Referrals:  None Maternal Substance Abuse:  No Significant Maternal Medications:  None Significant Maternal Lab Results: Lab values include: Other: hgb 8.5  Results for orders placed or performed during the hospital encounter of 08/21/17 (from the past 24 hour(s))  CBC   Collection Time: 08/22/17  1:15 AM  Result Value Ref Range   WBC 9.8 4.0 - 10.5 K/uL   RBC 3.94 3.87 - 5.11 MIL/uL   Hemoglobin 8.5 (L) 12.0 - 15.0 g/dL   HCT 65.727.9 (L) 84.636.0 - 96.246.0 %   MCV 70.8 (L) 78.0 - 100.0 fL   MCH 21.6 (L) 26.0 - 34.0 pg   MCHC 30.5 30.0 - 36.0 g/dL   RDW 95.224.5 (H) 84.111.5 - 32.415.5 %   Platelets 248 150 - 400 K/uL  Type and screen [facility]   Collection Time: 08/22/17  1:15 AM  Result Value Ref Range   ABO/RH(D) A POS    Antibody Screen NEG    Sample Expiration      08/25/2017 Performed at [facility], [address] [place], [place] [id number]     Patient Active Problem List   Diagnosis Date Noted  . Diastasis of rectus abdominis 08/22/2017  . Uterine contractions during pregnancy 08/22/2017  . Anemia affecting pregnancy 07/31/2017  . Uterine size date discrepancy pregnancy, third trimester 07/26/2017  . Frequent headaches 07/26/2017  . Supervision of other normal pregnancy, antepartum 07/10/2017  . History of C-section 07/10/2017    Assessment/Plan:  April Schmidt is a 27 y.o. G2P1001 at 5847w1d here for labor. Initially had some decels in the MAU but resolved with time. 1.5cm in office am of 4/24, but is now 4cm on most recent check. Will continue to monitor for expectant management. If progress slows down can start pitocin. Hgb 8.5 is only significant lab abnormality.  #Labor: expectant management #Pain: epidural #FWB: Cat 1 #ID:  NA #MOF: breast #MOC:undecided #Circ:  Yes in outpt clinic  Myrene BuddyJacob Fletcher, MD  08/22/2017, 2:40 AM   I  confirm that I have verified the information documented in the resident's note and that I have also personally reperformed the physical exam and all medical decision making activities.   Thressa ShellerHeather Nanami Whitelaw 4:50 AM 08/22/17

## 2017-08-22 NOTE — Anesthesia Pain Management Evaluation Note (Signed)
  [facility] Pain Management Visit Note  Patient: April Schmidt, 27 y.o., female  "Hello I am a member of the anesthesia team at [facility]. We have an anesthesia team available at all times to provide care throughout the hospital, including epidural management and anesthesia for C-section. I don't know your plan for the delivery whether it a natural birth, water birth, IV sedation, nitrous supplementation, doula or epidural, but we want to meet your pain goals."   1.Was your pain managed to your expectations on prior hospitalizations?   Yes   2.What is your expectation for pain management during this hospitalization?     Epidural  3.How can we help you reach that goal? Maintain epidural until delivery.  Record the patient's initial score and the patient's pain goal.   Pain: 4- pressure  Pain Goal: 4 [facility] wants you to be able to say your pain was always managed very well.  Ross Hefferan 08/22/2017

## 2017-08-22 NOTE — Anesthesia Postprocedure Evaluation (Signed)
Anesthesia Post Note  Patient: April Schmidt  Procedure(s) Performed: AN AD HOC LABOR EPIDURAL     Patient location during evaluation: Mother Baby Anesthesia Type: Epidural Level of consciousness: awake and alert and oriented Pain management: satisfactory to patient Vital Signs Assessment: post-procedure vital signs reviewed and stable Respiratory status: respiratory function stable Cardiovascular status: stable Postop Assessment: no headache, no backache, epidural receding, patient able to bend at knees, no signs of nausea or vomiting and adequate PO intake Anesthetic complications: no    Last Vitals:  Vitals:   08/22/17 1114 08/22/17 1230  BP: 124/73 123/79  Pulse: 65 62  Resp: 16 18  Temp: 37.1 C 36.8 C  SpO2:      Last Pain:  Vitals:   08/22/17 1230  TempSrc: Oral  PainSc:    Pain Goal:                 Cyle Kenyon

## 2017-08-23 LAB — CBC WITH DIFFERENTIAL/PLATELET
BASOS ABS: 0 10*3/uL (ref 0.0–0.1)
Basophils Relative: 0 %
EOS ABS: 0.1 10*3/uL (ref 0.0–0.7)
Eosinophils Relative: 1 %
HCT: 24.9 % — ABNORMAL LOW (ref 36.0–46.0)
Hemoglobin: 7.6 g/dL — ABNORMAL LOW (ref 12.0–15.0)
Lymphocytes Relative: 30 %
Lymphs Abs: 2.2 10*3/uL (ref 0.7–4.0)
MCH: 22.4 pg — ABNORMAL LOW (ref 26.0–34.0)
MCHC: 30.5 g/dL (ref 30.0–36.0)
MCV: 73.2 fL — ABNORMAL LOW (ref 78.0–100.0)
MONO ABS: 0.5 10*3/uL (ref 0.1–1.0)
Monocytes Relative: 6 %
Neutro Abs: 4.5 10*3/uL (ref 1.7–7.7)
Neutrophils Relative %: 63 %
PLATELETS: 186 10*3/uL (ref 150–400)
RBC: 3.4 MIL/uL — AB (ref 3.87–5.11)
RDW: 26.1 % — AB (ref 11.5–15.5)
WBC: 7.2 10*3/uL (ref 4.0–10.5)

## 2017-08-23 LAB — BIRTH TISSUE RECOVERY COLLECTION (PLACENTA DONATION)

## 2017-08-23 NOTE — Progress Notes (Signed)
POSTPARTUM PROGRESS NOTE  Post Partum Day #1  Subjective:  April Schmidt is a 27 y.o. [id number] 5865w1d s/p VBAC after presenting with spontaneous onset of labor.    No acute events overnight.  Pt denies problems with ambulating, voiding or po intake, aside from some chest tightness with ambulation. No palpitations or shortness of breath.   She denies nausea or vomiting.  Pain is well controlled.  She has had flatus. She has not had bowel movement.  Lochia Small.   Objective: Blood pressure 123/70, pulse 60, temperature 97.7 F (36.5 C), temperature source Oral, resp. rate 18, height 5\' 5"  (1.651 m), weight 73 kg (161 lb), SpO2 100 %, unknown if currently breastfeeding.  Physical Exam:  General: alert, cooperative and no distress Lochia:normal flow Chest: CTAB Heart: RRR no m/r/g Abdomen: +BS, soft, nontender,  Uterine Fundus: firm, palpable just inferior to the umbilicus DVT Evaluation: No calf swelling or tenderness Extremities: no lower extremity edema  Recent Labs    08/22/17 0115  HGB 8.5*  HCT 27.9*    Assessment/Plan:  ASSESSMENT: April Schmidt is a 27 y.o. [id number] 5565w1d s/p VBAC following TOLAC.   Will repeat hgb given patient had hgb of 8.5 prior to delivery and has had some intermittent chest tightness. Remains hemodynamically stable, otherwise.  Routine postpartum care otherwise Breast feeding Still uncertain about contraception Will have circumcision completed outpatient  Plan for discharge tomorrow   LOS: 1 day   Gorden HarmsMegan Brenlynn Fake, MD PGY-3 08/23/2017, 9:03 AM

## 2017-08-23 NOTE — Progress Notes (Signed)
CSW acknowledges consult for late PNC (31 weeks).  CSW completed chart review and MOB initiated PNC at 23 weeks while residing in in [place].  [facility]  CSW notified bedside nurse and CN.    CSW had security to delivery a Baby Bundle to MOB's room.  MOB was also receptive of referral to the Healthy Start program to assist with additional items.  There are no barriers to d/c.  Blaine HamperAngel Boyd-Gilyard, MSW, LCSW Clinical Social Work [phone]

## 2017-08-24 MED ORDER — IBUPROFEN 600 MG PO TABS: 600.0000 mg | ORAL_TABLET | Freq: Four times a day (QID) | ORAL | 0 refills | Status: DC

## 2017-08-24 NOTE — Discharge Summary (Signed)
OB Discharge Summary     Patient Name: April Schmidt DOB: Oct 20, 1990 MRN: [id number]  Date of admission: 08/21/2017 Delivering MD: Sharyon Cable   Date of discharge: 08/24/2017  Admitting diagnosis: 37WKS CTX Intrauterine pregnancy: [redacted]w[redacted]d     Secondary diagnosis:  Active Problems: None   Additional problems: None     Discharge diagnosis: Term Pregnancy Delivered, VBAC and Anemia                                                                                                Post partum procedures:None  Augmentation: None  Complications: None  Hospital course:  Onset of Labor With Vaginal Delivery     27 y.o. yo [id number] at [redacted]w[redacted]d was admitted in Active Labor on 08/21/2017. Patient had an uncomplicated labor course as follows:  Membrane Rupture Time/Date: 8:39 AM ,08/22/2017   Intrapartum Procedures: Episiotomy: None [1]                                         Lacerations:  Sulcus [9]  Patient had a delivery of a Viable infant. 08/22/2017  Information for the patient's newborn:  Beretta, Ginsberg [[id number]]  Delivery Method: VBAC, Spontaneous(Filed from Delivery Summary)    Pateint had an uncomplicated postpartum course.  She is ambulating, tolerating a regular diet, passing flatus, and urinating well. Patient is discharged home in stable condition on 08/24/17.   Physical exam  Vitals:   08/23/17 0030 08/23/17 0517 08/23/17 1900 08/24/17 0534  BP: 128/73 123/70 119/68 134/75  Pulse: 68 60 65 62  Resp: Temp:  97.7 F (36.5 C) 98 F (36.7 C)   TempSrc:  Oral Oral   SpO2:      Weight:      Height:       General: alert, cooperative and no distress Lochia: appropriate Uterine Fundus: firm Incision: N/A DVT Evaluation: No evidence of DVT seen on physical exam. Labs: Lab Results  Component Value Date   WBC 7.2 08/23/2017   HGB 7.6 (L) 08/23/2017   HCT 24.9 (L) 08/23/2017   MCV 73.2 (L) 08/23/2017   PLT 186 08/23/2017   No flowsheet data  found.  Discharge instruction: per After Visit Summary and "Baby and Me Booklet".  After visit meds:  Allergies as of 08/24/2017   No Known Allergies     Medication List    STOP taking these medications   acetaminophen 500 MG tablet Commonly known as:  TYLENOL     TAKE these medications   ibuprofen 600 MG tablet Commonly known as:  ADVIL,MOTRIN Take 1 tablet (600 mg total) by mouth every 6 (six) hours.   prenatal vitamin w/FE, FA 27-1 MG Tabs tablet Take 1 tablet by mouth daily at 12 noon.       Diet: iron-rich diet  Activity: Advance as tolerated. Pelvic rest for 6 weeks.   Outpatient follow up:4 weeks Follow up Appt: Future Appointments  Date Time Provider Department Center  10/03/2017  9:15 AM Burleson, Brand Males, NP [facility]   Follow up Visit:No follow-ups on file.  Postpartum contraception: Undecided  Newborn Data: Live born female  Birth Weight: 5 lb 1 oz (2296 g) APGAR: 9, 9  Newborn Delivery   Birth date/time:  08/22/2017 09:12:00 Delivery type:  VBAC, Spontaneous     Baby Feeding: Breast Disposition:home with mother   08/24/2017 Dorathy Kinsman, CNM

## 2017-08-24 NOTE — Discharge Instructions (Signed)
Vaginal Birth After Cesarean Delivery Vaginal birth after cesarean delivery (VBAC) is giving birth vaginally after previously delivering a baby by a cesarean. In the past, if a woman had a cesarean delivery, all births afterward would be done by cesarean delivery. This is no longer true. It can be safe for the mother to try a vaginal delivery after having a cesarean delivery. It is important to discuss VBAC with your health care provider early in the pregnancy so you can understand the risks, benefits, and options. It will give you time to decide what is best in your particular case. The final decision about whether to have a VBAC or repeat cesarean delivery should be between you and your health care provider. Any changes in your health or your baby's health during your pregnancy may make it necessary to change your initial decision about VBAC. Women who plan to have a VBAC should check with their health care provider to be sure that:  The previous cesarean delivery was done with a low transverse uterine cut (incision) (not a vertical classical incision).  The birth canal is big enough for the baby.  There were no other operations on the uterus.  An electronic fetal monitor (EFM) will be on at all times during labor.  An operating room will be available and ready in case an emergency cesarean delivery is needed.  A health care provider and surgical nursing staff will be available at all times during labor to be ready to do an emergency delivery cesarean if necessary.  An anesthesiologist will be present in case an emergency cesarean delivery is needed.  The nursery is prepared and has adequate personnel and necessary equipment available to care for the baby in case of an emergency cesarean delivery. Benefits of VBAC  Shorter stay in the hospital.  Avoidance of risks associated with cesarean delivery, such as: ? Surgical complications, such as opening of the incision or hernia in the  incision. ? Injury to other organs. ? Fever. This can occur if an infection develops after surgery. It can also occur as a reaction to the medicine given to make you numb during the surgery.  Less blood loss and need for blood transfusions.  Lower risk of blood clots and infection.  Shorter recovery.  Decreased risk for having to remove the uterus (hysterectomy).  Decreased risk for the placenta to completely or partially cover the opening of the uterus (placenta previa) with a future pregnancy.  Decrease risk in future labor and delivery. Risks of a VBAC  Tearing (rupture) of the uterus. This is occurs in less than 1% of VBACs. The risk of this happening is higher if: ? Steps are taken to begin the labor process (induce labor) or stimulate or strengthen contractions (augment labor). ? Medicine is used to soften (ripen) the cervix.  Having to remove the uterus (hysterectomy) if it ruptures. VBAC should not be done if:  The previous cesarean delivery was done with a vertical (classical) or T-shaped incision or you do not know what kind of incision was made.  You had a ruptured uterus.  You have had certain types of surgery on your uterus, such as removal of uterine fibroids. Ask your health care provider about other types of surgeries that prevent you from having a VBAC.  You have certain medical or childbirth (obstetrical) problems.  There are problems with the baby.  You have had two previous cesarean deliveries and no vaginal deliveries. Other facts to know about VBAC:  It   is safe to have an epidural anesthetic with VBAC.  It is safe to turn the baby from a breech position (attempt an external cephalic version).  It is safe to try a VBAC with twins.  VBAC may not be successful if your baby weights 8.8 lb (4 kg) or more. However, weight predictions are not always accurate and should not be used alone to decide if VBAC is right for you.  There is an increased failure rate  if the time between the cesarean delivery and VBAC is less than 19 months.  Your health care provider may advise against a VBAC if you have preeclampsia (high blood pressure, protein in the urine, and swelling of face and extremities).  VBAC is often successful if you previously gave birth vaginally.  VBAC is often successful when the labor starts spontaneously before the due date.  Delivering a baby through a VBAC is similar to having a normal spontaneous vaginal delivery. This information is not intended to replace advice given to you by your health care provider. Make sure you discuss any questions you have with your health care provider. Document Released: 10/07/2006 Document Revised: 09/22/2015 Document Reviewed: 11/13/2012 Elsevier Interactive Patient Education  2018 Elsevier Inc.  

## 2017-08-24 NOTE — Lactation Note (Signed)
This note was copied from a baby's chart. Lactation Consultation Note  Patient Name: April Schmidt [id number] Date: 08/24/2017  Mom and baby to be discharged this morning.  Baby is latching and also getting supplements of EBM.  Mom is pumping large volumes.  Mom does not have a pump at home.  She is unable to to afford a [facility] loaner.  Mom will use the manual pump over the weekend and call WIC on Monday.  Questions answered.  Lactation services and support reviewed and encouraged prn.   Maternal Data    Feeding Feeding Type: Bottle Fed - Breast Milk  LATCH Score                   Interventions    Lactation Tools Discussed/Used     Consult Status      [name] 08/24/2017, 9:50 AM

## 2017-08-28 ENCOUNTER — Encounter: Payer: Self-pay | Admitting: Student

## 2017-09-03 ENCOUNTER — Ambulatory Visit (HOSPITAL_COMMUNITY)
Admission: RE | Admit: 2017-09-03 | Discharge: 2017-09-03 | Disposition: A | Discharge status: Home / Self Care | Payer: Medicaid Other | Source: Ambulatory Visit | Attending: Family Medicine | Admitting: Family Medicine

## 2017-09-03 DIAGNOSIS — O99013 Anemia complicating pregnancy, third trimester: Secondary | ICD-10-CM | POA: Insufficient documentation

## 2017-09-03 MED ORDER — SODIUM CHLORIDE 0.9 % IV SOLN
510.0000 mg | INTRAVENOUS | Status: AC
  Administered 2017-09-03: 510 mg via INTRAVENOUS
  Filled 2017-09-03: qty 17

## 2017-09-03 NOTE — Discharge Instructions (Signed)

## 2017-09-04 ENCOUNTER — Encounter: Payer: Self-pay | Admitting: Nurse Practitioner

## 2017-09-10 ENCOUNTER — Encounter: Payer: Self-pay | Admitting: Obstetrics and Gynecology

## 2017-09-10 ENCOUNTER — Encounter: Payer: Self-pay | Admitting: *Deleted

## 2017-09-10 ENCOUNTER — Encounter (HOSPITAL_COMMUNITY): Payer: Medicaid Other

## 2017-10-03 ENCOUNTER — Ambulatory Visit: Payer: Medicaid Other | Admitting: Nurse Practitioner

## 2017-11-19 ENCOUNTER — Ambulatory Visit (INDEPENDENT_AMBULATORY_CARE_PROVIDER_SITE_OTHER): Payer: Medicaid Other | Admitting: Advanced Practice Midwife

## 2017-11-19 DIAGNOSIS — Z1389 Encounter for screening for other disorder: Secondary | ICD-10-CM | POA: Diagnosis not present

## 2017-11-19 MED ORDER — LEVONORGESTREL-ETHINYL ESTRAD 0.1-20 MG-MCG PO TABS: 1.0000 | ORAL_TABLET | Freq: Every day | ORAL | 11 refills | Status: DC

## 2017-11-19 NOTE — Progress Notes (Signed)
Post Partum Exam  April Schmidt is a 27 y.o. 772P2002 female who presents for a postpartum visit. She is 8 weeks postpartum following a vaginal delivery on I have fully reviewed the prenatal and intrapartum course. The delivery was at 37  gestational weeks.  Anesthesia: Epidual. Postpartum course has been uncomplicated. Baby's course has been uncomplicated . Baby is feeding by bottle. Bleeding not at this time. Bowel function is normal. Bladder function is normal. Patient is not sexually active. Contraception method is nothing at this time but desires pills. Postpartum depression screening:negative    Last pap smear done 05/06/2017 normal   Review of Systems Pertinent items are noted in HPI.    Objective:  unknown if currently breastfeeding.  General:  alert, cooperative and no distress   Breasts:  negative  Lungs: clear to auscultation bilaterally  Heart:  regular rate and rhythm, S1, S2 normal, no murmur, click, rub or gallop  Abdomen: abnormal findings:  diastasis recti   Vulva:  not evaluated  Vagina: normal vagina  Cervix:  not examined   Corpus: not examined  Adnexa:  not evaluated  Rectal Exam: Not performed.        Assessment:    Routine postpartum exam. Pap smear not done at today's visit.   Plan:   1. Contraception: OCP (estrogen/progesterone) 2. Considering paragard, will return if desires  3. Follow up in: 1 year or as needed.

## 2017-11-19 NOTE — Patient Instructions (Signed)
[employer]   [email or web address]

## 2018-01-23 ENCOUNTER — Other Ambulatory Visit: Payer: Self-pay

## 2018-02-07 DIAGNOSIS — J4540 Moderate persistent asthma, uncomplicated: Secondary | ICD-10-CM | POA: Insufficient documentation

## 2021-03-30 ENCOUNTER — Ambulatory Visit: Payer: Medicaid Other

## 2021-03-30 ENCOUNTER — Other Ambulatory Visit (HOSPITAL_COMMUNITY)
Admission: RE | Admit: 2021-03-30 | Discharge: 2021-03-30 | Disposition: A | Discharge status: Home / Self Care | Payer: Medicaid Other | Source: Ambulatory Visit | Attending: Family Medicine | Admitting: Family Medicine

## 2021-03-30 ENCOUNTER — Encounter: Payer: Self-pay | Admitting: Family Medicine

## 2021-03-30 ENCOUNTER — Other Ambulatory Visit: Payer: Self-pay

## 2021-03-30 ENCOUNTER — Ambulatory Visit (INDEPENDENT_AMBULATORY_CARE_PROVIDER_SITE_OTHER): Payer: Medicaid Other | Admitting: Family Medicine

## 2021-03-30 VITALS — BP 123/77 | HR 61 | Wt 143.0 lb

## 2021-03-30 DIAGNOSIS — Z3480 Encounter for supervision of other normal pregnancy, unspecified trimester: Secondary | ICD-10-CM | POA: Insufficient documentation

## 2021-03-30 DIAGNOSIS — M6208 Separation of muscle (nontraumatic), other site: Secondary | ICD-10-CM

## 2021-03-30 DIAGNOSIS — Z3481 Encounter for supervision of other normal pregnancy, first trimester: Secondary | ICD-10-CM | POA: Diagnosis not present

## 2021-03-30 DIAGNOSIS — K429 Umbilical hernia without obstruction or gangrene: Secondary | ICD-10-CM | POA: Diagnosis not present

## 2021-03-30 DIAGNOSIS — Z98891 History of uterine scar from previous surgery: Secondary | ICD-10-CM

## 2021-03-30 DIAGNOSIS — Z3A12 12 weeks gestation of pregnancy: Secondary | ICD-10-CM

## 2021-03-30 DIAGNOSIS — O3680X0 Pregnancy with inconclusive fetal viability, not applicable or unspecified: Secondary | ICD-10-CM

## 2021-03-30 NOTE — Progress Notes (Signed)
Subjective:  April Schmidt is a G3P2002 [redacted]w[redacted]d by first trimester [place] today. being seen today for her first obstetrical visit.  Her obstetrical history is significant for  2 prior deliveries. First pregnancy was delivered by cesarean delivery due to breech. She had a VBAC at 37 weeks with her last pregnancy. She does desire to VBAC with this pregnancy. . Patient does intend to breast feed. Pregnancy history fully reviewed.  Patient reports no complaints.  BP 123/77   Pulse 61   Wt 143 lb (64.9 kg)   LMP 01/11/2021   BMI 23.80 kg/m   HISTORY: OB History  Gravida Para Term Preterm AB Living  3 2 2     2   SAB IAB Ectopic Multiple Live Births        0 2    # Outcome Date GA Lbr Len/2nd Weight Sex Delivery Anes PTL Lv  3 Current           2 Term 08/22/17 [redacted]w[redacted]d 06:12 / 02:27 5 lb 1 oz (2.296 kg) M VBAC EPI  LIV  1 Term     M CS-Unspec   LIV    Past Medical History:  Diagnosis Date   Medical history non-contributory     Past Surgical History:  Procedure Laterality Date   CESAREAN SECTION     breech presentation    Family History  Problem Relation Age of Onset   Hypertension Mother    Asthma Mother    Diabetes Maternal Grandmother    Diabetes Paternal Grandmother      Exam  BP 123/77   Pulse 61   Wt 143 lb (64.9 kg)   LMP 01/11/2021   BMI 23.80 kg/m   Chaperone present during exam  CONSTITUTIONAL: Well-developed, well-nourished female in no acute distress.  HENT:  Normocephalic, atraumatic, External right and left ear normal. Oropharynx is clear and moist EYES: Conjunctivae and EOM are normal. Pupils are equal, round, and reactive to light. No scleral icterus.  NECK: Normal range of motion, supple, no masses.  Normal thyroid.  CARDIOVASCULAR: Normal heart rate noted, regular rhythm RESPIRATORY: Clear to auscultation bilaterally. Effort and breath sounds normal, no problems with respiration noted. BREASTS: Symmetric in size. No masses, skin changes, nipple  drainage, or lymphadenopathy. ABDOMEN: Soft, normal bowel sounds, no distention noted.  No tenderness, rebound or guarding. Has rectus diastasis. Also has 2cm umbilical hernia PELVIC: Normal appearing external genitalia; normal appearing vaginal mucosa and cervix. MUSCULOSKELETAL: Normal range of motion. No tenderness.  No cyanosis, clubbing, or edema.  2+ distal pulses. SKIN: Skin is warm and dry. No rash noted. Not diaphoretic. No erythema. No pallor. NEUROLOGIC: Alert and oriented to person, place, and time. Normal reflexes, muscle tone coordination. No cranial nerve deficit noted. PSYCHIATRIC: Normal mood and affect. Normal behavior. Normal judgment and thought content.    Assessment:    Pregnancy: 01/13/2021 Patient Active Problem List   Diagnosis Date Noted   Supervision of other normal pregnancy, antepartum 03/30/2021   Diastasis of rectus abdominis 08/22/2017   Uterine contractions during pregnancy 08/22/2017   VBAC, delivered 08/22/2017   Anemia affecting pregnancy 07/31/2017   Uterine size date discrepancy pregnancy, third trimester 07/26/2017   Frequent headaches 07/26/2017   History of C-section 07/10/2017      Plan:   1. Encounter to determine fetal viability of pregnancy, single or unspecified fetus  - 07/12/2017 OB Limited; Future  2. Supervision of other normal pregnancy, antepartum FHT and FH normal Initial labs obtained Continue prenatal  vitamins Reviewed n/v relief measures and warning s/s to report Reviewed recommended weight gain based on pre-gravid BMI Encouraged well-balanced diet Genetic & carrier screening discussed: requests Panorama, Ultrasound discussed; fetal survey: requested CCNC completed> form faxed if has or is planning to apply for medicaid The nature of [facility] [employer] with multiple MDs and other Advanced Practice Providers was explained to patient; also emphasized that fellows, residents, and students are part of our  team. - CBC/D/Plt+RPR+Rh+ABO+RubIgG... - Hepatitis C Antibody - [place] MFM OB DETAIL +14 WK; Future - [facility] AMB BABYSCRIPTS OPT IN - GC/Chlamydia probe amp ([place])not at [facility] - Culture, OB Urine - Genetic Screening  3. History of C-section Desires VBAC. Discussed risks  4. Diastasis of rectus abdominis   5. History of VBAC  6. Umbilical hernia without obstruction and without gangrene Reducible. No tenderness. Discussed warning signs of incarceration.     Problem list reviewed and updated. 75% of 30 min visit spent on counseling and coordination of care.     Levie Heritage 03/30/2021

## 2021-03-31 ENCOUNTER — Encounter: Payer: Self-pay | Admitting: General Practice

## 2021-03-31 LAB — CBC/D/PLT+RPR+RH+ABO+RUBIGG...
Antibody Screen: NEGATIVE
Basophils Absolute: 0 10*3/uL (ref 0.0–0.2)
Basos: 0 %
EOS (ABSOLUTE): 0.1 10*3/uL (ref 0.0–0.4)
Eos: 3 %
HCV Ab: 0.2 s/co ratio (ref 0.0–0.9)
HIV Screen 4th Generation wRfx: NONREACTIVE
Hematocrit: 30.1 % — ABNORMAL LOW (ref 34.0–46.6)
Hemoglobin: 9.4 g/dL — ABNORMAL LOW (ref 11.1–15.9)
Hepatitis B Surface Ag: NEGATIVE
Immature Grans (Abs): 0 10*3/uL (ref 0.0–0.1)
Immature Granulocytes: 0 %
Lymphocytes Absolute: 1.2 10*3/uL (ref 0.7–3.1)
Lymphs: 26 %
MCH: 22.5 pg — ABNORMAL LOW (ref 26.6–33.0)
MCHC: 31.2 g/dL — ABNORMAL LOW (ref 31.5–35.7)
MCV: 72 fL — ABNORMAL LOW (ref 79–97)
Monocytes Absolute: 0.5 10*3/uL (ref 0.1–0.9)
Monocytes: 11 %
Neutrophils Absolute: 2.8 10*3/uL (ref 1.4–7.0)
Neutrophils: 60 %
Platelets: 276 10*3/uL (ref 150–450)
RBC: 4.17 x10E6/uL (ref 3.77–5.28)
RDW: 18.6 % — ABNORMAL HIGH (ref 11.7–15.4)
RPR Ser Ql: NONREACTIVE
Rh Factor: POSITIVE
Rubella Antibodies, IGG: 17.1 index (ref >0.99)
WBC: 4.6 10*3/uL (ref 3.4–10.8)

## 2021-03-31 LAB — GC/CHLAMYDIA PROBE AMP (~~LOC~~) NOT AT ARMC
Chlamydia: NEGATIVE
Comment: NEGATIVE
Comment: NORMAL
Neisseria Gonorrhea: NEGATIVE

## 2021-03-31 LAB — HCV INTERPRETATION

## 2021-03-31 LAB — HEPATITIS C ANTIBODY: Hep C Virus Ab: <0.1 s/co ratio (ref 0.0–0.9)

## 2021-04-01 LAB — URINE CULTURE, OB REFLEX

## 2021-04-01 LAB — CULTURE, OB URINE

## 2021-04-06 ENCOUNTER — Other Ambulatory Visit: Payer: Self-pay | Admitting: Family Medicine

## 2021-04-06 MED ORDER — FERROUS SULFATE 325 (65 FE) MG PO TBEC: 325.0000 mg | DELAYED_RELEASE_TABLET | ORAL | 2 refills | Status: DC

## 2021-04-14 ENCOUNTER — Encounter: Payer: Self-pay | Admitting: Family Medicine

## 2021-04-17 ENCOUNTER — Other Ambulatory Visit: Payer: Self-pay

## 2021-04-27 ENCOUNTER — Ambulatory Visit (INDEPENDENT_AMBULATORY_CARE_PROVIDER_SITE_OTHER): Payer: Medicaid Other | Admitting: Family Medicine

## 2021-04-27 ENCOUNTER — Other Ambulatory Visit: Payer: Self-pay

## 2021-04-27 VITALS — BP 125/81 | HR 70 | Wt 150.0 lb

## 2021-04-27 DIAGNOSIS — Z3A16 16 weeks gestation of pregnancy: Secondary | ICD-10-CM

## 2021-04-27 DIAGNOSIS — Z98891 History of uterine scar from previous surgery: Secondary | ICD-10-CM

## 2021-04-27 DIAGNOSIS — M6208 Separation of muscle (nontraumatic), other site: Secondary | ICD-10-CM

## 2021-04-27 DIAGNOSIS — Z3480 Encounter for supervision of other normal pregnancy, unspecified trimester: Secondary | ICD-10-CM

## 2021-04-27 DIAGNOSIS — O34219 Maternal care for unspecified type scar from previous cesarean delivery: Secondary | ICD-10-CM

## 2021-04-27 NOTE — Progress Notes (Signed)
° °  PRENATAL VISIT NOTE  Subjective:  April Schmidt is a 30 y.o. G3P2002 at [redacted]w[redacted]d being seen today for ongoing prenatal care.  She is currently monitored for the following issues for this high-risk pregnancy and has History of C-section; Uterine size date discrepancy pregnancy, third trimester; Frequent headaches; Anemia affecting pregnancy; Diastasis of rectus abdominis; Uterine contractions during pregnancy; VBAC, delivered; and Supervision of other normal pregnancy, antepartum on their problem list.  Patient reports  had an episode of sharp lower abdominal pain a couple weeks ago. Went away after several minutes .  Contractions: Not present. Vag. Bleeding: None.  Movement: Absent. Denies leaking of fluid.   The following portions of the patient's history were reviewed and updated as appropriate: allergies, current medications, past family history, past medical history, past social history, past surgical history and problem list.   Objective:   Vitals:   04/27/21 1415  BP: 125/81  Pulse: 70  Weight: 150 lb (68 kg)    Fetal Status: Fetal Heart Rate (bpm): 153   Movement: Absent     General:  Alert, oriented and cooperative. Patient is in no acute distress.  Skin: Skin is warm and dry. No rash noted.   Cardiovascular: Normal heart rate noted  Respiratory: Normal respiratory effort, no problems with respiration noted  Abdomen: Soft, gravid, appropriate for gestational age.  Pain/Pressure: Absent     Pelvic: Cervical exam deferred        Extremities: Normal range of motion.  Edema: None  Mental Status: Normal mood and affect. Normal behavior. Normal judgment and thought content.   Assessment and Plan:  Pregnancy: G3P2002 at 107w3d 1. [redacted] weeks gestation of pregnancy - AFP, Serum, Open Spina Bifida  2. Supervision of other normal pregnancy, antepartum FHT and FH normal  3. Diastasis of rectus abdominis  4. History of C-section Desires TOLAC  5. VBAC, delivered  Preterm labor  symptoms and general obstetric precautions including but not limited to vaginal bleeding, contractions, leaking of fluid and fetal movement were reviewed in detail with the patient. Please refer to After Visit Summary for other counseling recommendations.   No follow-ups on file.  Future Appointments  Date Time Provider Department Center  05/17/2021  9:30 AM [facility] NURSE [facility]  05/17/2021  9:45 AM [facility] US4 [facility] [place]  05/25/2021  9:35 AM Levie Heritage, DO [facility] None  06/22/2021  9:35 AM Levie Heritage, DO [facility] None    Levie Heritage, DO

## 2021-04-29 LAB — AFP, SERUM, OPEN SPINA BIFIDA
AFP MoM: 1.21
AFP Value: 48.4 ng/mL
Gest. Age on Collection Date: 16.3 weeks
Maternal Age At EDD: 28.7 yr
OSBR Risk 1 IN: 10000
Test Results:: NEGATIVE
Weight: 150 [lb_av]

## 2021-04-30 NOTE — L&D Delivery Note (Signed)
Delivery Note Pt had SROM w/mod mec fluid soon after epidural. Cx was C/C/+2. After a 20 minute 2nd stage, at 7:18 AM a viable female was delivered via VBAC, Spontaneous (Presentation:   Occiput Posterior).  APGAR: 8/9, ; weight pending. After 1 minute, the cord was clamped and cut. 40 units of pitocin diluted in 1000cc LR was infused rapidly IV.  The placenta separated spontaneously and delivered via CCT and maternal pushing effort.  It was inspected and appears to be intact with a 3 VC. Anesthesia: Epidural Episiotomy: None Lacerations: None Suture Repair:  Est. Blood Loss (mL): 75  Mom to postpartum.  Baby to Couplet care / Skin to Skin.  April Schmidt 09/24/2021, 8:01 AM

## 2021-05-17 ENCOUNTER — Ambulatory Visit: Payer: Medicaid Other | Admitting: *Deleted

## 2021-05-17 ENCOUNTER — Other Ambulatory Visit: Payer: Self-pay | Admitting: *Deleted

## 2021-05-17 ENCOUNTER — Other Ambulatory Visit: Payer: Self-pay

## 2021-05-17 ENCOUNTER — Encounter: Payer: Self-pay | Admitting: *Deleted

## 2021-05-17 ENCOUNTER — Ambulatory Visit: Payer: Medicaid Other | Attending: Family Medicine

## 2021-05-17 VITALS — BP 118/59 | HR 71

## 2021-05-17 DIAGNOSIS — Z3480 Encounter for supervision of other normal pregnancy, unspecified trimester: Secondary | ICD-10-CM

## 2021-05-17 DIAGNOSIS — O358XX0 Maternal care for other (suspected) fetal abnormality and damage, not applicable or unspecified: Secondary | ICD-10-CM | POA: Insufficient documentation

## 2021-05-17 DIAGNOSIS — Z3A19 19 weeks gestation of pregnancy: Secondary | ICD-10-CM | POA: Insufficient documentation

## 2021-05-17 DIAGNOSIS — Z148 Genetic carrier of other disease: Secondary | ICD-10-CM | POA: Insufficient documentation

## 2021-05-17 DIAGNOSIS — O26842 Uterine size-date discrepancy, second trimester: Secondary | ICD-10-CM | POA: Insufficient documentation

## 2021-05-17 DIAGNOSIS — O34219 Maternal care for unspecified type scar from previous cesarean delivery: Secondary | ICD-10-CM | POA: Diagnosis not present

## 2021-05-17 DIAGNOSIS — Z363 Encounter for antenatal screening for malformations: Secondary | ICD-10-CM | POA: Insufficient documentation

## 2021-05-17 DIAGNOSIS — O280 Abnormal hematological finding on antenatal screening of mother: Secondary | ICD-10-CM

## 2021-05-25 ENCOUNTER — Ambulatory Visit (INDEPENDENT_AMBULATORY_CARE_PROVIDER_SITE_OTHER): Payer: Medicaid Other | Admitting: Family Medicine

## 2021-05-25 ENCOUNTER — Other Ambulatory Visit: Payer: Self-pay

## 2021-05-25 VITALS — BP 120/66 | HR 65 | Wt 153.0 lb

## 2021-05-25 DIAGNOSIS — M6208 Separation of muscle (nontraumatic), other site: Secondary | ICD-10-CM

## 2021-05-25 DIAGNOSIS — Z3480 Encounter for supervision of other normal pregnancy, unspecified trimester: Secondary | ICD-10-CM

## 2021-05-25 DIAGNOSIS — O34219 Maternal care for unspecified type scar from previous cesarean delivery: Secondary | ICD-10-CM

## 2021-05-25 DIAGNOSIS — Z98891 History of uterine scar from previous surgery: Secondary | ICD-10-CM

## 2021-05-25 DIAGNOSIS — Z3A20 20 weeks gestation of pregnancy: Secondary | ICD-10-CM

## 2021-05-25 NOTE — Progress Notes (Signed)
° °  PRENATAL VISIT NOTE  Subjective:  April Schmidt is a 31 y.o. G3P2002 at [redacted]w[redacted]d being seen today for ongoing prenatal care.  She is currently monitored for the following issues for this low-risk pregnancy and has History of C-section; Uterine size date discrepancy pregnancy, third trimester; Frequent headaches; Anemia affecting pregnancy; Diastasis of rectus abdominis; Uterine contractions during pregnancy; VBAC, delivered; and Supervision of other normal pregnancy, antepartum on their problem list.  Patient reports no complaints.  Contractions: Not present. Vag. Bleeding: None.  Movement: Present. Denies leaking of fluid.   The following portions of the patient's history were reviewed and updated as appropriate: allergies, current medications, past family history, past medical history, past social history, past surgical history and problem list.   Objective:   Vitals:   05/25/21 0935  BP: 120/66  Pulse: 65  Weight: 153 lb (69.4 kg)    Fetal Status: Fetal Heart Rate (bpm): 161   Movement: Present     General:  Alert, oriented and cooperative. Patient is in no acute distress.  Skin: Skin is warm and dry. No rash noted.   Cardiovascular: Normal heart rate noted  Respiratory: Normal respiratory effort, no problems with respiration noted  Abdomen: Soft, gravid, appropriate for gestational age.  Pain/Pressure: Absent     Pelvic: Cervical exam deferred        Extremities: Normal range of motion.  Edema: None  Mental Status: Normal mood and affect. Normal behavior. Normal judgment and thought content.   Assessment and Plan:  Pregnancy: G3P2002 at [redacted]w[redacted]d 1. [redacted] weeks gestation of pregnancy  2. Supervision of other normal pregnancy, antepartum FHT and FH normal  3. Diastasis of rectus abdominis No problems  4. History of C-section Desires TOLAC  5. VBAC, delivered  Preterm labor symptoms and general obstetric precautions including but not limited to vaginal bleeding, contractions,  leaking of fluid and fetal movement were reviewed in detail with the patient. Please refer to After Visit Summary for other counseling recommendations.   No follow-ups on file.  Future Appointments  Date Time Provider Department Center  06/15/2021  9:15 AM [facility] NURSE [facility]  06/15/2021  9:30 AM [facility] US3 [facility]  06/15/2021 10:30 AM [facility] GENETIC COUNSELING RM [facility]  06/22/2021  9:35 AM Levie Heritage, DO [facility] None    Levie Heritage, DO

## 2021-05-25 NOTE — Addendum Note (Signed)
Addended by: Anell Barr on: 05/25/2021 10:00 AM   Modules accepted: Orders

## 2021-06-15 ENCOUNTER — Ambulatory Visit: Payer: Medicaid Other

## 2021-06-15 ENCOUNTER — Ambulatory Visit: Payer: Medicaid Other | Attending: Obstetrics and Gynecology

## 2021-06-22 ENCOUNTER — Other Ambulatory Visit: Payer: Self-pay

## 2021-06-22 ENCOUNTER — Ambulatory Visit (INDEPENDENT_AMBULATORY_CARE_PROVIDER_SITE_OTHER): Payer: Medicaid Other | Admitting: Family Medicine

## 2021-06-22 VITALS — BP 127/55 | HR 72 | Wt 158.0 lb

## 2021-06-22 DIAGNOSIS — Z3480 Encounter for supervision of other normal pregnancy, unspecified trimester: Secondary | ICD-10-CM

## 2021-06-22 DIAGNOSIS — Z98891 History of uterine scar from previous surgery: Secondary | ICD-10-CM

## 2021-06-22 DIAGNOSIS — Z3A24 24 weeks gestation of pregnancy: Secondary | ICD-10-CM

## 2021-06-22 DIAGNOSIS — M6208 Separation of muscle (nontraumatic), other site: Secondary | ICD-10-CM

## 2021-06-22 NOTE — Progress Notes (Signed)
° °  PRENATAL VISIT NOTE  Subjective:  April Schmidt is a 31 y.o. G3P2002 at [redacted]w[redacted]d being seen today for ongoing prenatal care.  She is currently monitored for the following issues for this low-risk pregnancy and has History of C-section; Uterine size date discrepancy pregnancy, third trimester; Frequent headaches; Anemia affecting pregnancy; Diastasis of rectus abdominis; Uterine contractions during pregnancy; VBAC, delivered; and Supervision of other normal pregnancy, antepartum on their problem list.  Patient reports no complaints.  Contractions: Not present. Vag. Bleeding: None.  Movement: Present. Denies leaking of fluid.   The following portions of the patient's history were reviewed and updated as appropriate: allergies, current medications, past family history, past medical history, past social history, past surgical history and problem list.   Objective:   Vitals:   06/22/21 0943  BP: (!) 127/55  Pulse: 72  Weight: 158 lb (71.7 kg)    Fetal Status: Fetal Heart Rate (bpm): 152   Movement: Present     General:  Alert, oriented and cooperative. Patient is in no acute distress.  Skin: Skin is warm and dry. No rash noted.   Cardiovascular: Normal heart rate noted  Respiratory: Normal respiratory effort, no problems with respiration noted  Abdomen: Soft, gravid, appropriate for gestational age.  Pain/Pressure: Absent     Pelvic: Cervical exam deferred        Extremities: Normal range of motion.  Edema: None  Mental Status: Normal mood and affect. Normal behavior. Normal judgment and thought content.   Assessment and Plan:  Pregnancy: G3P2002 at [redacted]w[redacted]d 1. [redacted] weeks gestation of pregnancy  2. Supervision of other normal pregnancy, antepartum FHT and FH normal  3. Diastasis of rectus abdominis Having increased pressure with the rectus diastasis. Recommended abd support band  4. History of C-section Had term VBAC. Desires TOLAC   Preterm labor symptoms and general obstetric  precautions including but not limited to vaginal bleeding, contractions, leaking of fluid and fetal movement were reviewed in detail with the patient. Please refer to After Visit Summary for other counseling recommendations.   No follow-ups on file.  Future Appointments  Date Time Provider Department Center  07/20/2021  8:15 AM Levie Heritage, DO [facility] None  08/03/2021  1:50 PM Levie Heritage, DO [facility] None  08/17/2021 11:15 AM Levie Heritage, DO [facility] None    Levie Heritage, DO

## 2021-07-20 ENCOUNTER — Other Ambulatory Visit: Payer: Self-pay

## 2021-07-20 ENCOUNTER — Ambulatory Visit (INDEPENDENT_AMBULATORY_CARE_PROVIDER_SITE_OTHER): Payer: Medicaid Other | Admitting: Family Medicine

## 2021-07-20 ENCOUNTER — Encounter: Payer: Self-pay | Admitting: General Practice

## 2021-07-20 VITALS — BP 120/56 | HR 67 | Wt 164.0 lb

## 2021-07-20 DIAGNOSIS — Z3A28 28 weeks gestation of pregnancy: Secondary | ICD-10-CM

## 2021-07-20 DIAGNOSIS — Z3480 Encounter for supervision of other normal pregnancy, unspecified trimester: Secondary | ICD-10-CM

## 2021-07-20 DIAGNOSIS — M6208 Separation of muscle (nontraumatic), other site: Secondary | ICD-10-CM

## 2021-07-20 DIAGNOSIS — O99013 Anemia complicating pregnancy, third trimester: Secondary | ICD-10-CM

## 2021-07-20 DIAGNOSIS — Z98891 History of uterine scar from previous surgery: Secondary | ICD-10-CM

## 2021-07-20 MED ORDER — ACCU-CHEK GUIDE W/DEVICE KIT: 1.0000 [IU] | PACK | 0 refills | Status: DC

## 2021-07-20 MED ORDER — ACCU-CHEK GUIDE VI STRP: ORAL_STRIP | 12 refills | Status: DC

## 2021-07-20 MED ORDER — ACCU-CHEK SOFTCLIX LANCETS MISC: 1.0000 | Freq: Four times a day (QID) | 12 refills | Status: DC

## 2021-07-20 NOTE — Progress Notes (Signed)
Pt states she would like to do 2 hr GTT at next visit.  ?Jaynell Castagnola l Aysha Livecchi, CMA  ?

## 2021-07-20 NOTE — Progress Notes (Signed)
? ?  PRENATAL VISIT NOTE ? ?Subjective:  ?April Schmidt is a 31 y.o. G3P2002 at [redacted]w[redacted]d being seen today for ongoing prenatal care.  She is currently monitored for the following issues for this low-risk pregnancy and has History of C-section; Uterine size date discrepancy pregnancy, third trimester; Frequent headaches; Anemia affecting pregnancy; Diastasis of rectus abdominis; Uterine contractions during pregnancy; VBAC, delivered; and Supervision of other normal pregnancy, antepartum on their problem list. ? ?Patient reports no complaints.  Contractions: Irritability. Vag. Bleeding: None.  Movement: Present. Denies leaking of fluid.  ? ?The following portions of the patient's history were reviewed and updated as appropriate: allergies, current medications, past family history, past medical history, past social history, past surgical history and problem list.  ? ?Objective:  ? ?Vitals:  ? 07/20/21 0821  ?BP: (!) 120/56  ?Pulse: 67  ?Weight: 164 lb (74.4 kg)  ? ? ?Fetal Status: Fetal Heart Rate (bpm): 157   Movement: Present    ? ?General:  Alert, oriented and cooperative. Patient is in no acute distress.  ?Skin: Skin is warm and dry. No rash noted.   ?Cardiovascular: Normal heart rate noted  ?Respiratory: Normal respiratory effort, no problems with respiration noted  ?Abdomen: Soft, gravid, appropriate for gestational age.  Pain/Pressure: Absent     ?Pelvic: Cervical exam deferred        ?Extremities: Normal range of motion.  Edema: None  ?Mental Status: Normal mood and affect. Normal behavior. Normal judgment and thought content.  ? ?Assessment and Plan:  ?Pregnancy: G3P2002 at [redacted]w[redacted]d ?1. [redacted] weeks gestation of pregnancy ?- CBC ?- HIV Antibody (routine testing w rflx) ?- RPR ? ?2. Supervision of other normal pregnancy, antepartum ?FHT and FH normal. Doesn't want to do GTT. Will check CBGs for next two weeks. If normal, can stop. ?- CBC ?- HIV Antibody (routine testing w rflx) ?- RPR ? ?3. Diastasis of rectus  abdominis ? ?4. History of C-section ?Successful vbac. Desires repeat vbac ? ?5. History of VBAC ? ?6. Anemia affecting pregnancy in third trimester ?Check CBC today. ? ?Preterm labor symptoms and general obstetric precautions including but not limited to vaginal bleeding, contractions, leaking of fluid and fetal movement were reviewed in detail with the patient. ?Please refer to After Visit Summary for other counseling recommendations.  ? ?No follow-ups on file. ? ?Future Appointments  ?Date Time Provider Department Center  ?08/03/2021  1:50 PM Levie Heritage, DO [facility] None  ?08/17/2021 11:15 AM Levie Heritage, DO [facility] None  ? ? ?Levie Heritage, DO ?

## 2021-07-20 NOTE — Addendum Note (Signed)
Addended by: Levie Heritage on: 07/20/2021 08:42 AM ? ? Modules accepted: Orders ? ?

## 2021-07-21 ENCOUNTER — Telehealth: Payer: Self-pay

## 2021-07-21 LAB — RPR: RPR Ser Ql: NONREACTIVE

## 2021-07-21 LAB — CBC
Hematocrit: 25.4 % — ABNORMAL LOW (ref 34.0–46.6)
Hemoglobin: 8 g/dL — ABNORMAL LOW (ref 11.1–15.9)
MCH: 22 pg — ABNORMAL LOW (ref 26.6–33.0)
MCHC: 31.5 g/dL (ref 31.5–35.7)
MCV: 70 fL — ABNORMAL LOW (ref 79–97)
Platelets: 252 10*3/uL (ref 150–450)
RBC: 3.64 x10E6/uL — ABNORMAL LOW (ref 3.77–5.28)
RDW: 16.8 % — ABNORMAL HIGH (ref 11.7–15.4)
WBC: 5.9 10*3/uL (ref 3.4–10.8)

## 2021-07-21 LAB — HIV ANTIBODY (ROUTINE TESTING W REFLEX): HIV Screen 4th Generation wRfx: NONREACTIVE

## 2021-07-21 NOTE — Addendum Note (Signed)
Addended by: Levie Heritage on: 07/21/2021 08:21 AM ? ? Modules accepted: Orders ? ?

## 2021-07-21 NOTE — Telephone Encounter (Signed)
-----   Message from Jacob J Stinson, DO sent at 07/21/2021  8:21 AM EDT ----- ?Needs to be scheduled for venofer infusions 300mg weekly x 3 weeks. Orders placed ?

## 2021-07-21 NOTE — Telephone Encounter (Signed)
Called pt to inform her that she is anemic and will need to be scheduled for iron infusions. Left message for pt to call the office back. ?Cruze Zingaro l Gergory Biello, CMA  ?

## 2021-07-31 ENCOUNTER — Telehealth: Payer: Self-pay

## 2021-07-31 NOTE — Telephone Encounter (Signed)
-----   Message from Truett Mainland, DO sent at 07/21/2021  8:21 AM EDT ----- ?Needs to be scheduled for venofer infusions 300mg  weekly x 3 weeks. Orders placed ?

## 2021-07-31 NOTE — Telephone Encounter (Signed)
Have left multiple message for patient to schedule venofer infusions. Will note for next visit. Armandina Stammer RN  ?

## 2021-08-03 ENCOUNTER — Encounter: Payer: Medicaid Other | Admitting: Family Medicine

## 2021-08-03 ENCOUNTER — Telehealth: Payer: Self-pay

## 2021-08-03 NOTE — Telephone Encounter (Signed)
Left message for patient to call the office in regards to giving her instructions and date and time for her iron infusions. Armandina Stammer RN  ?

## 2021-08-03 NOTE — Telephone Encounter (Signed)
-----   Message from Marti Sleigh, [place] sent at 08/03/2021  8:21 AM EDT ----- ?Regarding: Infusion ?States that Dr. Adrian Blackwater wanted her to have an infusion.  Would like to be scheduled.  Expecting call back some time today.  She cancelled her appt today b/c she forgot about it.  I offered virtual, but she declined. ? ?

## 2021-08-17 ENCOUNTER — Ambulatory Visit (INDEPENDENT_AMBULATORY_CARE_PROVIDER_SITE_OTHER): Payer: Medicaid Other | Admitting: Family Medicine

## 2021-08-17 VITALS — BP 110/66 | HR 78 | Wt 170.0 lb

## 2021-08-17 DIAGNOSIS — O99013 Anemia complicating pregnancy, third trimester: Secondary | ICD-10-CM

## 2021-08-17 DIAGNOSIS — Z98891 History of uterine scar from previous surgery: Secondary | ICD-10-CM

## 2021-08-17 DIAGNOSIS — Z3A32 32 weeks gestation of pregnancy: Secondary | ICD-10-CM

## 2021-08-17 DIAGNOSIS — M6208 Separation of muscle (nontraumatic), other site: Secondary | ICD-10-CM

## 2021-08-17 DIAGNOSIS — Z3480 Encounter for supervision of other normal pregnancy, unspecified trimester: Secondary | ICD-10-CM

## 2021-08-17 NOTE — Progress Notes (Signed)
? ?  PRENATAL VISIT NOTE ? ?Subjective:  ?April Schmidt is a 31 y.o. G3P2002 at [redacted]w[redacted]d being seen today for ongoing prenatal care.  She is currently monitored for the following issues for this high-risk pregnancy and has History of C-section; Uterine size date discrepancy pregnancy, third trimester; Frequent headaches; Anemia affecting pregnancy; Diastasis of rectus abdominis; Uterine contractions during pregnancy; VBAC, delivered; and Supervision of other normal pregnancy, antepartum on their problem list. ? ?Patient reports having cramping and increased vaginal pressure.  Contractions: Irritability. Vag. Bleeding: None.  Movement: Present. Denies leaking of fluid.  ? ?The following portions of the patient's history were reviewed and updated as appropriate: allergies, current medications, past family history, past medical history, past social history, past surgical history and problem list.  ? ?Objective:  ? ?Vitals:  ? 08/17/21 1113  ?BP: 110/66  ?Pulse: 78  ?Weight: 170 lb (77.1 kg)  ? ? ?Fetal Status: Fetal Heart Rate (bpm): 155 Fundal Height: 32 cm Movement: Present  Presentation: Vertex ? ?General:  Alert, oriented and cooperative. Patient is in no acute distress.  ?Skin: Skin is warm and dry. No rash noted.   ?Cardiovascular: Normal heart rate noted  ?Respiratory: Normal respiratory effort, no problems with respiration noted  ?Abdomen: Soft, gravid, appropriate for gestational age.  Pain/Pressure: Absent     ?Pelvic: Cervical exam performed in the presence of a chaperone Dilation: Closed Effacement (%): Thick    ?Extremities: Normal range of motion.  Edema: None  ?Mental Status: Normal mood and affect. Normal behavior. Normal judgment and thought content.  ? ?Assessment and Plan:  ?Pregnancy: [id number] at [redacted]w[redacted]d ?1. [redacted] weeks gestation of pregnancy ? ?2. Supervision of other normal pregnancy, antepartum ?FHT and FH normal ? ?3. History of C-section ?Had successful VBAC ? ?4. Diastasis of rectus abdominis ? ?5.  Anemia affecting pregnancy in third trimester ?Declines iron infusion ? ?Preterm labor symptoms and general obstetric precautions including but not limited to vaginal bleeding, contractions, leaking of fluid and fetal movement were reviewed in detail with the patient. ?Please refer to After Visit Summary for other counseling recommendations.  ? ?No follow-ups on file. ? ?Future Appointments  ?Date Time Provider [place]  ?08/23/2021  9:00 AM [facility] None  ?08/29/2021 10:15 AM Seabron Spates, CNM [facility] None  ?09/12/2021 10:15 AM Seabron Spates, CNM [facility] None  ?09/19/2021  9:35 AM Seabron Spates, CNM [facility] None  ?09/26/2021  9:35 AM Seabron Spates, CNM [facility] None  ?10/03/2021  9:35 AM Gavin Pound, CNM [facility] None  ?10/10/2021  9:55 AM Seabron Spates, CNM [facility] None  ? ? ?Truett Mainland, DO ?

## 2021-08-18 ENCOUNTER — Encounter: Payer: Self-pay | Admitting: Family Medicine

## 2021-08-23 ENCOUNTER — Encounter (HOSPITAL_COMMUNITY)
Admission: RE | Admit: 2021-08-23 | Discharge: 2021-08-23 | Disposition: A | Discharge status: Home / Self Care | Payer: Medicaid Other | Source: Ambulatory Visit | Attending: Family Medicine | Admitting: Family Medicine

## 2021-08-23 DIAGNOSIS — O99013 Anemia complicating pregnancy, third trimester: Secondary | ICD-10-CM | POA: Insufficient documentation

## 2021-08-23 MED ORDER — SODIUM CHLORIDE 0.9 % IV SOLN
300.0000 mg | INTRAVENOUS | Status: DC
  Administered 2021-08-23: 300 mg via INTRAVENOUS
  Filled 2021-08-23: qty 300

## 2021-08-29 ENCOUNTER — Ambulatory Visit (INDEPENDENT_AMBULATORY_CARE_PROVIDER_SITE_OTHER): Payer: Medicaid Other | Admitting: Advanced Practice Midwife

## 2021-08-29 ENCOUNTER — Encounter: Payer: Self-pay | Admitting: Advanced Practice Midwife

## 2021-08-29 VITALS — BP 135/68 | Wt 172.0 lb

## 2021-08-29 DIAGNOSIS — Z3A34 34 weeks gestation of pregnancy: Secondary | ICD-10-CM

## 2021-08-29 DIAGNOSIS — O99013 Anemia complicating pregnancy, third trimester: Secondary | ICD-10-CM

## 2021-08-29 DIAGNOSIS — M6208 Separation of muscle (nontraumatic), other site: Secondary | ICD-10-CM

## 2021-08-29 DIAGNOSIS — Z98891 History of uterine scar from previous surgery: Secondary | ICD-10-CM

## 2021-08-29 DIAGNOSIS — Z3480 Encounter for supervision of other normal pregnancy, unspecified trimester: Secondary | ICD-10-CM

## 2021-08-29 NOTE — Progress Notes (Signed)
? ?  PRENATAL VISIT NOTE ? ?Subjective:  ?April Schmidt is a 31 y.o. G3P2002 at [redacted]w[redacted]d being seen today for ongoing prenatal care.  She is currently monitored for the following issues for this high-risk pregnancy and has History of C-section; Uterine size date discrepancy pregnancy, third trimester; Frequent headaches; Anemia affecting pregnancy; Ventral hernia without obstruction or gangrene; Uterine contractions during pregnancy; VBAC, delivered; Supervision of other normal pregnancy, antepartum; and Moderate persistent asthma without complication on their problem list. ? ?Patient reports no complaints.  Contractions: Irritability. Vag. Bleeding: None.  Movement: Present. Denies leaking of fluid.  ? ?The following portions of the patient's history were reviewed and updated as appropriate: allergies, current medications, past family history, past medical history, past social history, past surgical history and problem list.  ? ?Objective:  ? ?Vitals:  ? 08/29/21 1012  ?BP: 135/68  ?Weight: 172 lb (78 kg)  ? ? ?Fetal Status:     Movement: Present    ? ?General:  Alert, oriented and cooperative. Patient is in no acute distress.  ?Skin: Skin is warm and dry. No rash noted.   ?Cardiovascular: Normal heart rate noted  ?Respiratory: Normal respiratory effort, no problems with respiration noted  ?Abdomen: Soft, gravid, appropriate for gestational age.  Pain/Pressure: Present     ?Pelvic: Cervical exam deferred        ?Extremities: Normal range of motion.  Edema: None  ?Mental Status: Normal mood and affect. Normal behavior. Normal judgment and thought content.  ? ?Assessment and Plan:  ?Pregnancy: G3P2002 at [redacted]w[redacted]d ?1. [redacted] weeks gestation of pregnancy ? ? ?2. History of C-section ?    Plans another TOLAC, had VBAC last time ? ?3. History of VBAC ? ? ?4. Diastasis of rectus abdominis ?    No problems, wears abdominal support belt low on abdomen ? ?5. Supervision of other normal pregnancy, antepartum ? ? ?6. Anemia affecting  pregnancy in third trimester ?    Per Dr Nehemiah Settle, pt declined her Venofer infusions ?    Hgb 8 on last test ? ?Preterm labor symptoms and general obstetric precautions including but not limited to vaginal bleeding, contractions, leaking of fluid and fetal movement were reviewed in detail with the patient. ?Please refer to After Visit Summary for other counseling recommendations.  ? ?Return in about 2 weeks (around 09/12/2021) for [facility]. ? ?Future Appointments  ?Date Time Provider [place]  ?08/30/2021  1:30 PM [facility] NURSE [facility]  ?08/30/2021  1:45 PM [facility] US4 [facility]  ?09/01/2021  9:00 AM [facility] None  ?09/12/2021 10:15 AM Seabron Spates, CNM [facility] None  ?09/19/2021  9:35 AM Seabron Spates, CNM [facility] None  ?09/26/2021  9:35 AM Seabron Spates, CNM [facility] None  ?10/03/2021  9:35 AM Gavin Pound, CNM [facility] None  ?10/10/2021  9:55 AM Seabron Spates, CNM [facility] None  ? ? ?Hansel Feinstein, CNM ?

## 2021-08-30 ENCOUNTER — Encounter (HOSPITAL_COMMUNITY): Payer: Medicaid Other

## 2021-08-30 ENCOUNTER — Other Ambulatory Visit: Payer: Self-pay | Admitting: *Deleted

## 2021-08-30 ENCOUNTER — Ambulatory Visit (HOSPITAL_BASED_OUTPATIENT_CLINIC_OR_DEPARTMENT_OTHER): Payer: Medicaid Other

## 2021-08-30 ENCOUNTER — Ambulatory Visit: Payer: Medicaid Other | Attending: Obstetrics and Gynecology | Admitting: *Deleted

## 2021-08-30 ENCOUNTER — Other Ambulatory Visit: Payer: Self-pay | Admitting: Obstetrics and Gynecology

## 2021-08-30 ENCOUNTER — Encounter: Payer: Self-pay | Admitting: *Deleted

## 2021-08-30 ENCOUNTER — Ambulatory Visit (HOSPITAL_BASED_OUTPATIENT_CLINIC_OR_DEPARTMENT_OTHER): Payer: Medicaid Other | Admitting: Obstetrics and Gynecology

## 2021-08-30 VITALS — BP 121/61 | HR 77

## 2021-08-30 DIAGNOSIS — Z3A34 34 weeks gestation of pregnancy: Secondary | ICD-10-CM

## 2021-08-30 DIAGNOSIS — Z148 Genetic carrier of other disease: Secondary | ICD-10-CM | POA: Diagnosis not present

## 2021-08-30 DIAGNOSIS — O34219 Maternal care for unspecified type scar from previous cesarean delivery: Secondary | ICD-10-CM

## 2021-08-30 DIAGNOSIS — O365930 Maternal care for other known or suspected poor fetal growth, third trimester, not applicable or unspecified: Secondary | ICD-10-CM

## 2021-08-30 DIAGNOSIS — O283 Abnormal ultrasonic finding on antenatal screening of mother: Secondary | ICD-10-CM

## 2021-08-30 DIAGNOSIS — O99013 Anemia complicating pregnancy, third trimester: Secondary | ICD-10-CM | POA: Diagnosis not present

## 2021-08-30 DIAGNOSIS — O285 Abnormal chromosomal and genetic finding on antenatal screening of mother: Secondary | ICD-10-CM | POA: Diagnosis not present

## 2021-08-30 DIAGNOSIS — D649 Anemia, unspecified: Secondary | ICD-10-CM

## 2021-08-30 DIAGNOSIS — Z3480 Encounter for supervision of other normal pregnancy, unspecified trimester: Secondary | ICD-10-CM

## 2021-08-30 DIAGNOSIS — O280 Abnormal hematological finding on antenatal screening of mother: Secondary | ICD-10-CM

## 2021-08-30 DIAGNOSIS — D573 Sickle-cell trait: Secondary | ICD-10-CM

## 2021-08-30 DIAGNOSIS — O365990 Maternal care for other known or suspected poor fetal growth, unspecified trimester, not applicable or unspecified: Secondary | ICD-10-CM

## 2021-08-30 DIAGNOSIS — O358XX0 Maternal care for other (suspected) fetal abnormality and damage, not applicable or unspecified: Secondary | ICD-10-CM

## 2021-08-30 NOTE — Progress Notes (Signed)
Maternal-Fetal Medicine  ? ?Name: Audia Funaro ?DOB: 10/21/1990 ?MRN: [id number] ?Referring Provider: Loma Boston, MD ? ?I had the pleasure of seeing Ms. Rail [facility] at [facility].  She returned for completion of fetal anatomy.  Patient has not had screening for gestational diabetes. ? ?Blood pressure today at her office is 121/61 mmHg. ? ?Ultrasound ?On today's ultrasound, the estimated fetal weight is at the 5th percentile.  Abdominal circumference measurement is at the 7th percentile.  Amniotic fluid is normal and good fetal activity seen.  Umbilical artery Doppler showed normal forward diastolic flow.  Antenatal testing is reassuring.  BPP 8/8.  Some fetal anatomical structures could be completed and appears normal. ? ?Fetal growth restriction ?I explained the finding of fetal growth restriction that is difficult to differentiate from a constitutionally small for gestational age fetus. ?I discussed the possible causes of fetal growth restriction including placental insufficiency (most common cause), chromosomal anomalies and fetal infections.  Patient did not give history of fever or rashes. ?Chromosomal anomalies seen late-onset fetal growth restriction are less likely. ?I discussed ultrasound protocol of monitoring fetal growth restriction. ?Timing of delivery: Since small for gestational age fetuses have a higher chance of having perinatal mortality and morbidity, delivery at 4 to [redacted] weeks gestation is reasonable.  If, however, severe fetal growth restriction is seen with normal antenatal testing, we will recommend delivery at [redacted] weeks gestation. ?I emphasized the importance of weekly antenatal testing. ? ?Recommendations ?-Weekly BPP and UA Doppler studies till delivery. ?-Fetal growth assessment in 3 weeks and recommendation on timing of delivery after next fetal growth assessment. ? ?Thank you for consultation.  If you have any questions or concerns, please contact me the [facility].  Consultation including face-to-face (more than 50%) counseling 30 minutes. ? ?

## 2021-09-01 ENCOUNTER — Inpatient Hospital Stay (HOSPITAL_COMMUNITY): Admission: RE | Admit: 2021-09-01 | Payer: Medicaid Other | Source: Ambulatory Visit

## 2021-09-08 ENCOUNTER — Inpatient Hospital Stay (HOSPITAL_COMMUNITY): Admission: RE | Admit: 2021-09-08 | Payer: Medicaid Other | Source: Ambulatory Visit

## 2021-09-12 ENCOUNTER — Other Ambulatory Visit (HOSPITAL_COMMUNITY)
Admission: RE | Admit: 2021-09-12 | Discharge: 2021-09-12 | Disposition: A | Discharge status: Home / Self Care | Payer: Medicaid Other | Source: Ambulatory Visit | Attending: Advanced Practice Midwife | Admitting: Advanced Practice Midwife

## 2021-09-12 ENCOUNTER — Ambulatory Visit (INDEPENDENT_AMBULATORY_CARE_PROVIDER_SITE_OTHER): Payer: Medicaid Other | Admitting: Advanced Practice Midwife

## 2021-09-12 ENCOUNTER — Encounter: Payer: Self-pay | Admitting: Advanced Practice Midwife

## 2021-09-12 VITALS — BP 119/73 | HR 66 | Wt 176.0 lb

## 2021-09-12 DIAGNOSIS — O0993 Supervision of high risk pregnancy, unspecified, third trimester: Secondary | ICD-10-CM | POA: Insufficient documentation

## 2021-09-12 DIAGNOSIS — Z3A36 36 weeks gestation of pregnancy: Secondary | ICD-10-CM

## 2021-09-12 DIAGNOSIS — Z98891 History of uterine scar from previous surgery: Secondary | ICD-10-CM | POA: Diagnosis not present

## 2021-09-12 DIAGNOSIS — O99013 Anemia complicating pregnancy, third trimester: Secondary | ICD-10-CM

## 2021-09-12 DIAGNOSIS — M6208 Separation of muscle (nontraumatic), other site: Secondary | ICD-10-CM

## 2021-09-12 DIAGNOSIS — O365930 Maternal care for other known or suspected poor fetal growth, third trimester, not applicable or unspecified: Secondary | ICD-10-CM

## 2021-09-12 NOTE — Progress Notes (Signed)
? ?  PRENATAL VISIT NOTE ? ?Subjective:  ?April Schmidt is a 31 y.o. G3P2002 at [redacted]w[redacted]d being seen today for ongoing prenatal care.  She is currently monitored for the following issues for this high-risk pregnancy and has History of C-section; Uterine size date discrepancy pregnancy, third trimester; Frequent headaches; Anemia affecting pregnancy; Ventral hernia without obstruction or gangrene; Uterine contractions during pregnancy; VBAC, delivered; Supervision of other normal pregnancy, antepartum; and Moderate persistent asthma without complication on their problem list. ? ?Patient reports occasional contractions.  Contractions: Irritability. Vag. Bleeding: None.  Movement: Present. Denies leaking of fluid.  ? ?The following portions of the patient's history were reviewed and updated as appropriate: allergies, current medications, past family history, past medical history, past social history, past surgical history and problem list.  ? ?Objective:  ? ?Vitals:  ? 09/12/21 1011  ?BP: 119/73  ?Pulse: 66  ?Weight: 176 lb (79.8 kg)  ? ? ?Fetal Status: Fetal Heart Rate (bpm): 134   Movement: Present    ? ?General:  Alert, oriented and cooperative. Patient is in no acute distress.  ?Skin: Skin is warm and dry. No rash noted.   ?Cardiovascular: Normal heart rate noted  ?Respiratory: Normal respiratory effort, no problems with respiration noted  ?Abdomen: Soft, gravid, appropriate for gestational age.  Pain/Pressure: Present     ?Pelvic: Cervical exam performed in the presence of a chaperone      Cervix closed/50%/ballotable  ?Extremities: Normal range of motion.  Edema: Trace  ?Mental Status: Normal mood and affect. Normal behavior. Normal judgment and thought content.  ? ?Assessment and Plan:  ?Pregnancy: [id number] at [redacted]w[redacted]d ?1. [redacted] weeks gestation of pregnancy ? ?- Culture, beta strep (group b only) ?- GC/Chlamydia probe amp ([place])not at [facility] ? ?2. History of C-section ?    Plans TOLAC ?- Culture, beta strep (group b  only) ?- GC/Chlamydia probe amp ([place])not at [facility] ? ?3. Diastasis of rectus abdominis ?    Stable, wears support belt ? ?4. History of VBAC ? ? ?5. Anemia affecting pregnancy in third trimester ?    Declined venofer, use TXA at delivery ? ?6. Poor fetal growth affecting management of mother in third trimester, single or unspecified fetus ?    Followed  by MFM ? ?Preterm labor symptoms and general obstetric precautions including but not limited to vaginal bleeding, contractions, leaking of fluid and fetal movement were reviewed in detail with the patient. ?Please refer to After Visit Summary for other counseling recommendations.  ? ? ? ?Future Appointments  ?Date Time Provider [place]  ?09/19/2021  9:35 AM Seabron Spates, CNM [facility] None  ?09/21/2021  8:15 AM [facility] NURSE [facility]  ?09/21/2021  8:30 AM [facility] US3 [facility]  ?09/26/2021  9:35 AM Seabron Spates, CNM [facility] None  ?10/03/2021  9:35 AM Gavin Pound, CNM [facility] None  ?10/10/2021  9:55 AM Seabron Spates, CNM [facility] None  ? ? ?Hansel Feinstein, CNM ?

## 2021-09-13 LAB — GC/CHLAMYDIA PROBE AMP (~~LOC~~) NOT AT ARMC
Chlamydia: NEGATIVE
Comment: NEGATIVE
Comment: NORMAL
Neisseria Gonorrhea: NEGATIVE

## 2021-09-16 LAB — CULTURE, BETA STREP (GROUP B ONLY): Strep Gp B Culture: POSITIVE — AB

## 2021-09-19 ENCOUNTER — Encounter: Payer: Self-pay | Admitting: Advanced Practice Midwife

## 2021-09-19 ENCOUNTER — Ambulatory Visit (INDEPENDENT_AMBULATORY_CARE_PROVIDER_SITE_OTHER): Payer: Medicaid Other | Admitting: Advanced Practice Midwife

## 2021-09-19 VITALS — BP 120/69 | HR 82 | Wt 179.0 lb

## 2021-09-19 DIAGNOSIS — M6208 Separation of muscle (nontraumatic), other site: Secondary | ICD-10-CM

## 2021-09-19 DIAGNOSIS — Z98891 History of uterine scar from previous surgery: Secondary | ICD-10-CM

## 2021-09-19 DIAGNOSIS — O365930 Maternal care for other known or suspected poor fetal growth, third trimester, not applicable or unspecified: Secondary | ICD-10-CM

## 2021-09-19 DIAGNOSIS — Z3A37 37 weeks gestation of pregnancy: Secondary | ICD-10-CM

## 2021-09-19 DIAGNOSIS — O99820 Streptococcus B carrier state complicating pregnancy: Secondary | ICD-10-CM | POA: Insufficient documentation

## 2021-09-19 DIAGNOSIS — Z3480 Encounter for supervision of other normal pregnancy, unspecified trimester: Secondary | ICD-10-CM

## 2021-09-19 NOTE — Progress Notes (Signed)
   PRENATAL VISIT NOTE  Subjective:  April Schmidt is a 31 y.o. G3P2002 at [redacted]w[redacted]d being seen today for ongoing prenatal care.  She is currently monitored for the following issues for this high-risk pregnancy and has History of C-section; Uterine size date discrepancy pregnancy, third trimester; Frequent headaches; Anemia affecting pregnancy; Ventral hernia without obstruction or gangrene; Uterine contractions during pregnancy; VBAC, delivered; Supervision of other normal pregnancy, antepartum; Moderate persistent asthma without complication; and GBS (group B Streptococcus carrier), +RV culture, currently pregnant on their problem list.  Patient reports no complaints.  Contractions: Irritability. Vag. Bleeding: None.  Movement: Present. Denies leaking of fluid.   The following portions of the patient's history were reviewed and updated as appropriate: allergies, current medications, past family history, past medical history, past social history, past surgical history and problem list.   Objective:   Vitals:   09/19/21 0936  BP: 120/69  Pulse: 82  Weight: 179 lb (81.2 kg)    Fetal Status:     Movement: Present     General:  Alert, oriented and cooperative. Patient is in no acute distress.  Skin: Skin is warm and dry. No rash noted.   Cardiovascular: Normal heart rate noted  Respiratory: Normal respiratory effort, no problems with respiration noted  Abdomen: Soft, gravid, appropriate for gestational age.  Pain/Pressure: Present     Pelvic: Cervical exam performed in the presence of a chaperone      Cervix loose 1/60%/-2/vertex  Extremities: Normal range of motion.  Edema: Trace  Mental Status: Normal mood and affect. Normal behavior. Normal judgment and thought content.   Assessment and Plan:  Pregnancy: G3P2002 at [redacted]w[redacted]d 1. [redacted] weeks gestation of pregnancy       2. History of C-section     Plans TOLAC  3. Diastasis of rectus abdominis     No obstruction or incarceration  4.  History of VBAC     TOLAC  5. Poor fetal growth affecting management of mother in third trimester, single or unspecified fetus     MFM recommends IOL at 37-28 weeks.  Patient is resistant to this plan.  Thinks baby is just constitutionally small   Agrees to [place] this week and we discussed if fluid becomes low, there is more danger of placental comproromise.  Will continue testing and discussion  6. Supervision of other normal pregnancy, antepartum   7. GBS (group B Streptococcus carrier), +RV culture, currently pregnant     Treat in labor  Term labor symptoms and general obstetric precautions including but not limited to vaginal bleeding, contractions, leaking of fluid and fetal movement were reviewed in detail with the patient. Please refer to After Visit Summary for other counseling recommendations.     Future Appointments  Date Time Provider [place]  09/21/2021  8:15 AM [facility] NURSE [facility]  09/21/2021  8:30 AM [facility] US3 [facility]  09/26/2021  9:35 AM Seabron Spates, CNM [facility] None  10/03/2021  9:35 AM Gavin Pound, CNM [facility] None    Hansel Feinstein, CNM

## 2021-09-21 ENCOUNTER — Other Ambulatory Visit: Payer: Self-pay | Admitting: *Deleted

## 2021-09-21 ENCOUNTER — Ambulatory Visit: Payer: Medicaid Other | Attending: Obstetrics and Gynecology

## 2021-09-21 ENCOUNTER — Ambulatory Visit: Payer: Medicaid Other | Admitting: *Deleted

## 2021-09-21 ENCOUNTER — Encounter: Payer: Self-pay | Admitting: *Deleted

## 2021-09-21 VITALS — BP 125/75 | HR 72

## 2021-09-21 DIAGNOSIS — O365990 Maternal care for other known or suspected poor fetal growth, unspecified trimester, not applicable or unspecified: Secondary | ICD-10-CM | POA: Insufficient documentation

## 2021-09-21 DIAGNOSIS — O365930 Maternal care for other known or suspected poor fetal growth, third trimester, not applicable or unspecified: Secondary | ICD-10-CM | POA: Diagnosis not present

## 2021-09-21 DIAGNOSIS — O283 Abnormal ultrasonic finding on antenatal screening of mother: Secondary | ICD-10-CM | POA: Insufficient documentation

## 2021-09-21 DIAGNOSIS — Z148 Genetic carrier of other disease: Secondary | ICD-10-CM | POA: Diagnosis not present

## 2021-09-21 DIAGNOSIS — D649 Anemia, unspecified: Secondary | ICD-10-CM

## 2021-09-21 DIAGNOSIS — O34219 Maternal care for unspecified type scar from previous cesarean delivery: Secondary | ICD-10-CM | POA: Diagnosis present

## 2021-09-21 DIAGNOSIS — O285 Abnormal chromosomal and genetic finding on antenatal screening of mother: Secondary | ICD-10-CM | POA: Diagnosis not present

## 2021-09-21 DIAGNOSIS — Z3A37 37 weeks gestation of pregnancy: Secondary | ICD-10-CM

## 2021-09-21 DIAGNOSIS — O99013 Anemia complicating pregnancy, third trimester: Secondary | ICD-10-CM

## 2021-09-21 DIAGNOSIS — D573 Sickle-cell trait: Secondary | ICD-10-CM

## 2021-09-21 DIAGNOSIS — O358XX0 Maternal care for other (suspected) fetal abnormality and damage, not applicable or unspecified: Secondary | ICD-10-CM

## 2021-09-22 ENCOUNTER — Inpatient Hospital Stay (HOSPITAL_COMMUNITY)
Admission: AD | Admit: 2021-09-22 | Discharge: 2021-09-22 | Disposition: A | Discharge status: Home / Self Care | Payer: Medicaid Other | Attending: Obstetrics and Gynecology | Admitting: Obstetrics and Gynecology

## 2021-09-22 ENCOUNTER — Ambulatory Visit: Payer: Self-pay | Admitting: *Deleted

## 2021-09-22 ENCOUNTER — Encounter (HOSPITAL_COMMUNITY): Payer: Medicaid Other

## 2021-09-22 ENCOUNTER — Encounter (HOSPITAL_COMMUNITY): Payer: Self-pay | Admitting: Obstetrics and Gynecology

## 2021-09-22 ENCOUNTER — Other Ambulatory Visit: Payer: Self-pay

## 2021-09-22 DIAGNOSIS — O471 False labor at or after 37 completed weeks of gestation: Secondary | ICD-10-CM

## 2021-09-22 DIAGNOSIS — Z0371 Encounter for suspected problem with amniotic cavity and membrane ruled out: Secondary | ICD-10-CM | POA: Diagnosis not present

## 2021-09-22 DIAGNOSIS — Z3A37 37 weeks gestation of pregnancy: Secondary | ICD-10-CM | POA: Diagnosis not present

## 2021-09-22 LAB — POCT FERN TEST: POCT Fern Test: NEGATIVE

## 2021-09-22 NOTE — MAU Provider Note (Signed)
Chief Complaint:  Contractions and Rupture of Membranes   Event Date/Time   First Provider Initiated Contact with Patient 09/22/21 1851      HPI: April Schmidt is a 31 y.o. G3P2002 at [redacted]w[redacted]d who presents to maternity admissions reporting leaking clear fluid x 1 episode today and cramping/contractions several times per hour.  She is feeling normal fetal movement. The leaking soaked her underwear but did not require a pad.   HPI  Past Medical History: Past Medical History:  Diagnosis Date   Medical history non-contributory     Past obstetric history: OB History  Gravida Para Term Preterm AB Living  3 2 2     2   SAB IAB Ectopic Multiple Live Births        0 2    # Outcome Date GA Lbr Len/2nd Weight Sex Delivery Anes PTL Lv  3 Current           2 Term 08/22/17 [redacted]w[redacted]d 06:12 / 02:27 2296 g M VBAC EPI  LIV  1 Term     M CS-Unspec   LIV    Past Surgical History: Past Surgical History:  Procedure Laterality Date   CESAREAN SECTION     breech presentation    Family History: Family History  Problem Relation Age of Onset   Hypertension Mother    Asthma Mother    Diabetes Maternal Grandmother    Diabetes Paternal Grandmother     Social History: Social History   Tobacco Use   Smoking status: Never   Smokeless tobacco: Never  Vaping Use   Vaping Use: Never used  Substance Use Topics   Alcohol use: No   Drug use: No    Allergies: No Known Allergies  Meds:  Medications Prior to Admission  Medication Sig Dispense Refill Last Dose   albuterol (ACCUNEB) 0.63 MG/3ML nebulizer solution Take 3 mLs (0.63 mg dose) by nebulization every 6 (six) hours as needed for Wheezing.      albuterol (VENTOLIN HFA) 108 (90 Base) MCG/ACT inhaler Inhale into the lungs.      Ascorbic Acid (VITAMIN C) 100 MG tablet Take by mouth.      budesonide-formoterol (SYMBICORT) 80-4.5 MCG/ACT inhaler Inhale into the lungs. (Patient not taking: Reported on 09/12/2021)      Multiple Vitamin (MULTIVITAMIN)  capsule Take 1 capsule by mouth daily.       ROS:  Review of Systems  Constitutional:  Negative for chills, fatigue and fever.  Eyes:  Negative for visual disturbance.  Respiratory:  Negative for shortness of breath.   Cardiovascular:  Negative for chest pain.  Gastrointestinal:  Negative for abdominal pain, nausea and vomiting.  Genitourinary:  Positive for vaginal discharge. Negative for difficulty urinating, dysuria, flank pain, pelvic pain, vaginal bleeding and vaginal pain.  Neurological:  Negative for dizziness and headaches.  Psychiatric/Behavioral: Negative.      I have reviewed patient's Past Medical Hx, Surgical Hx, Family Hx, Social Hx, medications and allergies.   Physical Exam  Patient Vitals for the past 24 hrs:  BP Temp Temp src Pulse Resp SpO2 Height Weight  09/22/21 1811 124/69 98.4 F (36.9 C) Oral 77 16 100 % 5\' 5"  (1.651 m) 83 kg   Constitutional: Well-developed, well-nourished female in no acute distress.  Cardiovascular: normal rate Respiratory: normal effort GI: Abd soft, non-tender, gravid appropriate for gestational age.  MS: Extremities nontender, no edema, normal ROM Neurologic: Alert and oriented x 4.  GU: Neg CVAT.  PELVIC EXAM: moderate amount white  creamy discharge, vaginal walls and external genitalia normal   Dilation: 2 Effacement (%): 50 Presentation: Vertex Exam by:: Fatima Blank, CNM  FHT:  Baseline 150 , moderate variability, accelerations present, no decelerations Contractions: q 3-8 mins, irregular   Labs: No results found for this or any previous visit (from the past 24 hour(s)). A/Positive/-- (12/01 1100)  Imaging:   MAU Course/MDM: Orders Placed This Encounter  Procedures   Discharge patient    No orders of the defined types were placed in this encounter.    NST reviewed and reactive No evidence of ROM or of active labor D/C home with labor precautions Pt to keep appt in office, return to MAU  PRN   Assessment: 1. Encounter for suspected premature rupture of amniotic membranes, with rupture of membranes not found   2. [redacted] weeks gestation of pregnancy   3. False labor after 37 completed weeks of gestation     Plan: Discharge home Labor precautions and fetal kick counts  Follow-up [facility] Follow up.   Specialty: Obstetrics and Gynecology Why: As scheduled Contact information: [place] [phone] [facility] Follow up.   Specialty: Obstetrics and Gynecology Why: For signs of labor or emergencies Contact information: [address] [id number] [place] [phone]               Allergies as of 09/22/2021   No Known Allergies      Medication List     TAKE these medications    albuterol 0.63 MG/3ML nebulizer solution Commonly known as: ACCUNEB Take 3 mLs (0.63 mg dose) by nebulization every 6 (six) hours as needed for Wheezing.   albuterol 108 (90 Base) MCG/ACT inhaler Commonly known as: VENTOLIN HFA Inhale into the lungs.   budesonide-formoterol 80-4.5 MCG/ACT inhaler Commonly known as: SYMBICORT Inhale into the lungs.   multivitamin capsule Take 1 capsule by mouth daily.   vitamin C 100 MG tablet Take by mouth.        Fatima Blank Certified Nurse-Midwife 09/22/2021 7:14 PM

## 2021-09-22 NOTE — Telephone Encounter (Signed)
  Chief Complaint: possible rupture of membrane Symptoms: leaking fluid Frequency: 30-40 minutes Pertinent Negatives: Patient denies regular contractions Disposition: [x] ED /[] Urgent Care (no appt availability in office) / [] Appointment(In office/virtual)/ []  [facility]/ [] Home Care/ [] Refused Recommended Disposition /[] [facility]/ []  Follow-up with PCP Additional Notes:

## 2021-09-22 NOTE — Telephone Encounter (Signed)
Reason for Disposition  Leakage of fluid from vagina  Answer Assessment - Initial Assessment Questions 1. ONSET: "When did the symptoms begin?"        30-40 minutes 2. CONTRACTIONS: "Are you having any contractions?" If yes, ask: "Describe the contractions that you are having." (e.g., duration, frequency, regularity, severity)     Light contraction- irregular 3. EDD: "What date are you expecting to deliver?"     10/12/21 4. PARITY: "Have you had a baby before?" If Yes, ask: "How long did the labor last?"     Yes- 2 other, C- section, 12 hours 5. FETAL MOVEMENT: "Has the baby's movement decreased or changed significantly from normal?"     yes 6. OTHER SYMPTOMS: "Do you have any other symptoms?" (e.g., abdominal pain, vaginal bleeding, fever, hand/facial swelling)     no  Protocols used: Pregnancy - Rupture of Membranes Suspected-A-AH

## 2021-09-22 NOTE — MAU Note (Signed)
April Schmidt is a 31 y.o. at [redacted]w[redacted]d here in MAU reporting: thinks her water broke, was sitting and all of the sudden felt wetness. Little fluid since then.  Has been having some contractions, feeling a little pressure. No bleeding.  Reports +FM  Onset of complaint: 1430 Pain score: 1 Vitals:   09/22/21 1811  BP: 124/69  Pulse: 77  Resp: 16  Temp: 98.4 F (36.9 C)  SpO2: 100%     FHT:164 Lab orders placed from triage:  none

## 2021-09-22 NOTE — Discharge Instructions (Signed)
Reasons to return to MAU at [facility]:  1.  Contractions are  5 minutes apart or less, each last 1 minute, these have been going on for 1-2 hours, and you cannot walk or talk during them 2.  You have a large gush of fluid, or a trickle of fluid that will not stop and you have to wear a pad 3.  You have bleeding that is bright red, heavier than spotting--like menstrual bleeding (spotting can be normal in early labor or after a check of your cervix) 4.  You do not feel the baby moving like he/she normally does  

## 2021-09-24 ENCOUNTER — Encounter (HOSPITAL_COMMUNITY): Payer: Self-pay | Admitting: Obstetrics and Gynecology

## 2021-09-24 ENCOUNTER — Inpatient Hospital Stay (HOSPITAL_COMMUNITY): Payer: Medicaid Other | Admitting: Anesthesiology

## 2021-09-24 ENCOUNTER — Other Ambulatory Visit: Payer: Self-pay

## 2021-09-24 ENCOUNTER — Inpatient Hospital Stay (HOSPITAL_COMMUNITY)
Admission: AD | Admit: 2021-09-24 | Discharge: 2021-09-26 | DRG: 807 | Disposition: A | Discharge status: Home / Self Care | Payer: Medicaid Other | Attending: Family Medicine | Admitting: Family Medicine

## 2021-09-24 DIAGNOSIS — O99824 Streptococcus B carrier state complicating childbirth: Secondary | ICD-10-CM | POA: Diagnosis present

## 2021-09-24 DIAGNOSIS — Z3A37 37 weeks gestation of pregnancy: Secondary | ICD-10-CM | POA: Diagnosis not present

## 2021-09-24 DIAGNOSIS — O99820 Streptococcus B carrier state complicating pregnancy: Secondary | ICD-10-CM | POA: Diagnosis not present

## 2021-09-24 DIAGNOSIS — O365930 Maternal care for other known or suspected poor fetal growth, third trimester, not applicable or unspecified: Principal | ICD-10-CM | POA: Diagnosis present

## 2021-09-24 LAB — CBC
HCT: 31.2 % — ABNORMAL LOW (ref 36.0–46.0)
Hemoglobin: 9.5 g/dL — ABNORMAL LOW (ref 12.0–15.0)
MCH: 22.8 pg — ABNORMAL LOW (ref 26.0–34.0)
MCHC: 30.4 g/dL (ref 30.0–36.0)
MCV: 74.8 fL — ABNORMAL LOW (ref 80.0–100.0)
Platelets: 229 10*3/uL (ref 150–400)
RBC: 4.17 MIL/uL (ref 3.87–5.11)
RDW: 22.4 % — ABNORMAL HIGH (ref 11.5–15.5)
WBC: 7.6 10*3/uL (ref 4.0–10.5)
nRBC: 0 % (ref 0.0–0.2)

## 2021-09-24 LAB — TYPE AND SCREEN
ABO/RH(D): A POS
Antibody Screen: NEGATIVE

## 2021-09-24 LAB — RPR: RPR Ser Ql: NONREACTIVE

## 2021-09-24 MED ORDER — ACETAMINOPHEN 325 MG PO TABS: 650.0000 mg | ORAL_TABLET | ORAL | Status: DC | PRN

## 2021-09-24 MED ORDER — DIPHENHYDRAMINE HCL 50 MG/ML IJ SOLN: 12.5000 mg | INTRAMUSCULAR | Status: DC | PRN

## 2021-09-24 MED ORDER — OXYCODONE HCL 5 MG PO TABS: 10.0000 mg | ORAL_TABLET | ORAL | Status: DC | PRN

## 2021-09-24 MED ORDER — ERYTHROMYCIN 5 MG/GM OP OINT
TOPICAL_OINTMENT | OPHTHALMIC | Status: AC
  Filled 2021-09-24: qty 1

## 2021-09-24 MED ORDER — PHENYLEPHRINE 80 MCG/ML (10ML) SYRINGE FOR IV PUSH (FOR BLOOD PRESSURE SUPPORT): 80.0000 ug | PREFILLED_SYRINGE | INTRAVENOUS | Status: DC | PRN

## 2021-09-24 MED ORDER — LIDOCAINE HCL (PF) 1 % IJ SOLN
INTRAMUSCULAR | Status: DC | PRN
  Administered 2021-09-24: 10 mL via EPIDURAL

## 2021-09-24 MED ORDER — SENNOSIDES-DOCUSATE SODIUM 8.6-50 MG PO TABS
2.0000 | ORAL_TABLET | Freq: Every day | ORAL | Status: DC
  Administered 2021-09-26: 2 via ORAL
  Filled 2021-09-24 (×2): qty 2

## 2021-09-24 MED ORDER — LACTATED RINGERS IV SOLN
INTRAVENOUS | Status: DC
Start: 2021-09-24 — End: 2021-09-24

## 2021-09-24 MED ORDER — EPHEDRINE 5 MG/ML INJ: 10.0000 mg | INTRAVENOUS | Status: DC | PRN

## 2021-09-24 MED ORDER — ONDANSETRON HCL 4 MG PO TABS: 4.0000 mg | ORAL_TABLET | ORAL | Status: DC | PRN

## 2021-09-24 MED ORDER — MAGNESIUM HYDROXIDE 400 MG/5ML PO SUSP: 30.0000 mL | ORAL | Status: DC | PRN

## 2021-09-24 MED ORDER — COCONUT OIL OIL: 1.0000 "application " | TOPICAL_OIL | Status: DC | PRN

## 2021-09-24 MED ORDER — ONDANSETRON HCL 4 MG/2ML IJ SOLN
4.0000 mg | Freq: Four times a day (QID) | INTRAMUSCULAR | Status: DC | PRN
  Administered 2021-09-24: 4 mg via INTRAVENOUS
  Filled 2021-09-24: qty 2

## 2021-09-24 MED ORDER — SOD CITRATE-CITRIC ACID 500-334 MG/5ML PO SOLN: 30.0000 mL | ORAL | Status: DC | PRN

## 2021-09-24 MED ORDER — OXYCODONE HCL 5 MG PO TABS: 5.0000 mg | ORAL_TABLET | ORAL | Status: DC | PRN

## 2021-09-24 MED ORDER — PENICILLIN G POT IN DEXTROSE 60000 UNIT/ML IV SOLN: 3.0000 10*6.[IU] | INTRAVENOUS | Status: DC

## 2021-09-24 MED ORDER — LIDOCAINE HCL (PF) 1 % IJ SOLN
30.0000 mL | INTRAMUSCULAR | Status: DC | PRN
Start: 2021-09-24 — End: 2021-09-24

## 2021-09-24 MED ORDER — LACTATED RINGERS IV SOLN: 500.0000 mL | INTRAVENOUS | Status: DC | PRN

## 2021-09-24 MED ORDER — PRENATAL MULTIVITAMIN CH
1.0000 | ORAL_TABLET | Freq: Every day | ORAL | Status: DC
  Filled 2021-09-24: qty 1

## 2021-09-24 MED ORDER — FENTANYL-BUPIVACAINE-NACL 0.5-0.125-0.9 MG/250ML-% EP SOLN
12.0000 mL/h | EPIDURAL | Status: DC | PRN
  Administered 2021-09-24: 12 mL/h via EPIDURAL
  Filled 2021-09-24: qty 250

## 2021-09-24 MED ORDER — ZOLPIDEM TARTRATE 5 MG PO TABS: 5.0000 mg | ORAL_TABLET | Freq: Every evening | ORAL | Status: DC | PRN

## 2021-09-24 MED ORDER — BENZOCAINE-MENTHOL 20-0.5 % EX AERO
1.0000 "application " | INHALATION_SPRAY | CUTANEOUS | Status: DC | PRN
  Administered 2021-09-24: 1 via TOPICAL
  Filled 2021-09-24: qty 56

## 2021-09-24 MED ORDER — FERROUS SULFATE 325 (65 FE) MG PO TABS
325.0000 mg | ORAL_TABLET | Freq: Every day | ORAL | Status: DC
  Administered 2021-09-25 – 2021-09-26 (×2): 325 mg via ORAL
  Filled 2021-09-24 (×2): qty 1

## 2021-09-24 MED ORDER — WITCH HAZEL-GLYCERIN EX PADS: 1.0000 "application " | MEDICATED_PAD | CUTANEOUS | Status: DC | PRN

## 2021-09-24 MED ORDER — OXYCODONE-ACETAMINOPHEN 5-325 MG PO TABS: 2.0000 | ORAL_TABLET | ORAL | Status: DC | PRN

## 2021-09-24 MED ORDER — ACETAMINOPHEN 325 MG PO TABS
650.0000 mg | ORAL_TABLET | ORAL | Status: DC | PRN
  Administered 2021-09-24: 650 mg via ORAL
  Filled 2021-09-24: qty 2

## 2021-09-24 MED ORDER — IBUPROFEN 600 MG PO TABS
600.0000 mg | ORAL_TABLET | Freq: Four times a day (QID) | ORAL | Status: DC
  Administered 2021-09-24 – 2021-09-26 (×5): 600 mg via ORAL
  Filled 2021-09-24 (×7): qty 1

## 2021-09-24 MED ORDER — ONDANSETRON HCL 4 MG/2ML IJ SOLN: 4.0000 mg | INTRAMUSCULAR | Status: DC | PRN

## 2021-09-24 MED ORDER — PENICILLIN G POTASSIUM 5000000 UNITS IJ SOLR
5.0000 10*6.[IU] | Freq: Once | INTRAMUSCULAR | Status: AC
  Administered 2021-09-24: 5 10*6.[IU] via INTRAVENOUS
  Filled 2021-09-24: qty 5

## 2021-09-24 MED ORDER — DIBUCAINE (PERIANAL) 1 % EX OINT: 1.0000 "application " | TOPICAL_OINTMENT | CUTANEOUS | Status: DC | PRN

## 2021-09-24 MED ORDER — TETANUS-DIPHTH-ACELL PERTUSSIS 5-2.5-18.5 LF-MCG/0.5 IM SUSY: 0.5000 mL | PREFILLED_SYRINGE | Freq: Once | INTRAMUSCULAR | Status: DC

## 2021-09-24 MED ORDER — DIPHENHYDRAMINE HCL 25 MG PO CAPS: 25.0000 mg | ORAL_CAPSULE | Freq: Four times a day (QID) | ORAL | Status: DC | PRN

## 2021-09-24 MED ORDER — FLEET ENEMA 7-19 GM/118ML RE ENEM: 1.0000 | ENEMA | RECTAL | Status: DC | PRN

## 2021-09-24 MED ORDER — OXYTOCIN BOLUS FROM INFUSION
333.0000 mL | Freq: Once | INTRAVENOUS | Status: AC
Start: 2021-09-24 — End: 2021-09-24
  Administered 2021-09-24: 333 mL via INTRAVENOUS

## 2021-09-24 MED ORDER — LACTATED RINGERS IV SOLN: 500.0000 mL | Freq: Once | INTRAVENOUS | Status: DC

## 2021-09-24 MED ORDER — OXYCODONE-ACETAMINOPHEN 5-325 MG PO TABS: 1.0000 | ORAL_TABLET | ORAL | Status: DC | PRN

## 2021-09-24 MED ORDER — SIMETHICONE 80 MG PO CHEW: 80.0000 mg | CHEWABLE_TABLET | ORAL | Status: DC | PRN

## 2021-09-24 MED ORDER — OXYTOCIN-SODIUM CHLORIDE 30-0.9 UT/500ML-% IV SOLN
2.5000 [IU]/h | INTRAVENOUS | Status: DC
  Filled 2021-09-24: qty 500

## 2021-09-24 NOTE — Lactation Note (Signed)
This note was copied from a baby's chart. Lactation Consultation Note  Patient Name: April Schmidt FVCBS'W Date: 09/24/2021 Reason for consult: Initial assessment;Early term 37-38.6wks;Infant < 6lbs Age:29 hours   P3 mother whose infant is now 19 hours old.  This is an early term baby at 37+6 weeks.  Mother's current feeding preference is breast.  Two blood sugars after delivery were WNL.  RN in room for assessment.  Reviewed breast feeding basics with mother.  Observed hand expression; mother unable to express colostrum drops at this time.  Suggested she continue practicing and feed back any expressed milk she obtains to "[name]." Attempted to latch, however, he was too sleepy to initiate a suck.  Discussed with mother the importance of obtaining a wide gape prior to latching.  This will help with latching deeply onto the breast.  Mother verbalized understanding.  Placed him STS on mother's chest and he fell asleep.  Suggested she feed at least every three hours or sooner if baby shows cues.  Mother will call her RN/LC for latch assistance as needed.  No support person present at this time.   Maternal Data Has patient been taught Hand Expression?: Yes Does the patient have breastfeeding experience prior to this delivery?: Yes How long did the patient breastfeed?: 9 months with her first child  Feeding Mother's Current Feeding Choice: Breast Milk  LATCH Score Latch: Too sleepy or reluctant, no latch achieved, no sucking elicited.  Audible Swallowing: None  Type of Nipple: Everted at rest and after stimulation (Large nipples)  Comfort (Breast/Nipple): Soft / non-tender  Hold (Positioning): Assistance needed to correctly position infant at breast and maintain latch.  LATCH Score: 5   Lactation Tools Discussed/Used    Interventions Interventions: Breast feeding basics reviewed;Assisted with latch;Skin to skin;Breast massage;Hand express;Breast compression;Position  options;Support pillows;Adjust position;Education;LC Services brochure  Discharge Pump: Personal  Consult Status Consult Status: Follow-up Date: 09/25/21 Follow-up type: In-patient    Artice Holohan R Nello Corro 09/24/2021, 2:42 PM

## 2021-09-24 NOTE — Anesthesia Preprocedure Evaluation (Signed)
Anesthesia Evaluation  Patient identified by MRN, date of birth, ID band Patient awake    Reviewed: Allergy & Precautions, H&P , NPO status , Patient's Chart, lab work & pertinent test results  History of Anesthesia Complications Negative for: history of anesthetic complications  Airway Mallampati: II  TM Distance: >3 FB     Dental   Pulmonary asthma ,    Pulmonary exam normal        Cardiovascular negative cardio ROS   Rhythm:regular Rate:Normal     Neuro/Psych negative neurological ROS  negative psych ROS   GI/Hepatic negative GI ROS, Neg liver ROS,   Endo/Other  negative endocrine ROS  Renal/GU negative Renal ROS  negative genitourinary   Musculoskeletal   Abdominal   Peds  Hematology  (+) Blood dyscrasia, anemia ,   Anesthesia Other Findings   Reproductive/Obstetrics (+) Pregnancy (TOLAC\)                             Anesthesia Physical Anesthesia Plan  ASA: 2  Anesthesia Plan: Epidural   Post-op Pain Management:    Induction:   PONV Risk Score and Plan:   Airway Management Planned:   Additional Equipment:   Intra-op Plan:   Post-operative Plan:   Informed Consent: I have reviewed the patients History and Physical, chart, labs and discussed the procedure including the risks, benefits and alternatives for the proposed anesthesia with the patient or authorized representative who has indicated his/her understanding and acceptance.       Plan Discussed with:   Anesthesia Plan Comments:         Anesthesia Quick Evaluation

## 2021-09-24 NOTE — MAU Note (Signed)
.  April Schmidt is a 31 y.o. at [redacted]w[redacted]d here in MAU reporting: contractions-denies SROM, vaginal bleeding or bloody show. Endorses + fetal movement.  Onset of complaint: 2215 Pain score: 5 Vitals:   09/24/21 0202  BP: 132/84  Pulse: 81  Resp: 17  Temp: 98.1 F (36.7 C)  SpO2: 99%     FHT:142bpm Lab orders placed from triage:  mau labor

## 2021-09-24 NOTE — Anesthesia Postprocedure Evaluation (Signed)
Anesthesia Post Note  Patient: Vanna B Onken  Procedure(s) Performed: AN AD HOC LABOR EPIDURAL     Patient location during evaluation: Mother Baby Anesthesia Type: Epidural Level of consciousness: awake and alert Pain management: pain level controlled Vital Signs Assessment: post-procedure vital signs reviewed and stable Respiratory status: spontaneous breathing, nonlabored ventilation and respiratory function stable Cardiovascular status: stable Postop Assessment: no headache, no backache and epidural receding Anesthetic complications: no   No notable events documented.  Last Vitals:  Vitals:   09/24/21 1015 09/24/21 1415  BP: 132/78 113/66  Pulse: 65 89  Resp: 16 16  Temp: 37.1 C 36.9 C  SpO2: 99% 99%    Last Pain:  Vitals:   09/24/21 1415  TempSrc: Oral  PainSc: 0-No pain   Pain Goal:                   Gladys Deckard

## 2021-09-24 NOTE — Anesthesia Procedure Notes (Signed)
Epidural Patient location during procedure: OB Start time: 09/24/2021 5:11 AM End time: 09/24/2021 5:22 AM  Staffing Anesthesiologist: Lucretia Kern, MD Performed: anesthesiologist   Preanesthetic Checklist Completed: patient identified, IV checked, risks and benefits discussed, monitors and equipment checked, pre-op evaluation and timeout performed  Epidural Patient position: sitting Prep: DuraPrep Patient monitoring: heart rate, continuous pulse ox and blood pressure Approach: midline Location: L3-L4 Injection technique: LOR air  Needle:  Needle type: Tuohy  Needle gauge: 17 G Needle length: 9 cm Needle insertion depth: 5 cm Catheter type: closed end flexible Catheter size: 19 Gauge Catheter at skin depth: 10 cm Test dose: negative  Assessment Events: blood not aspirated, injection not painful, no injection resistance, no paresthesia and negative IV test  Additional Notes Reason for block:procedure for pain

## 2021-09-24 NOTE — Lactation Note (Addendum)
This note was copied from a baby's chart. Lactation Consultation Note  Patient Name: April Schmidt [id number] Date: 09/24/2021 Reason for consult: Follow-up assessment;Mother's request;Difficult latch;Early term 37-38.6wks;Infant < 6lbs;Breastfeeding assistance Age:29 hours  P3, Early Term, Infant Female <6lbs  LC called to patient room at 16:31 to assist with latch. LC attempted to help mom latch baby to left breast in football hold. Baby latched deeply, but only sucked for 2-3 sucks and came off the breast. We attempted to latch on the left in cross-cradle and baby did the same thing. LC switched baby to the right breast in the cross cradle, got mom pillows to support her, and assisted with sandwiching the breast. Baby fell asleep.   LC assisted with hand expressing, but no drops were noted on the right breast.   LC spoke with mom about pumping. Mom stated that she had asked multiple people for a pump today, but was not given a pump.   LC assisted mom with setting up the pump, reviewed pumping frequency, milk storage, and cleaning pump parts.   LC also made a hands free pumping top for mom.   RN entered the room and spoke with mom about supplementation.   Mom stated that she was open to donor milk.  [place] left donor milk paperwork with RN and got 2 37mL bottles of donor milk.   LC attempted to feed baby 68mL using a white slow flow nipple.   Baby sucked for 4-5 sucks and got about 6mL of milk before falling asleep.   Mom pumped 5 mL.   Mom attempted to supplement baby with her milk via the bottle with slow flow nipple.   Baby would not suck the bottle and fell asleep.   LC used a curved tip syringe and attempted to feed baby mom's expressed milk.   Baby took in 30mL via the syringe.   LC spoke with the RN and updated her on baby. RN stated that she would speak to the pediatrician.   Current Feeding Plan:  Mom will continue to latch baby with feeding cues and follow-up with  expressed milk or donor milk.  Mom will pump every 3 hours and feed baby her expressed milk according to supplementation guidelines.  If baby doesn't latch, mom will feed baby according to bottle feeding guidelines.  Mom will call for lactation support as needed.   LATCH Score Latch: Too sleepy or reluctant, no latch achieved, no sucking elicited.  Audible Swallowing: None  Type of Nipple: Everted at rest and after stimulation  Comfort (Breast/Nipple): Soft / non-tender  Hold (Positioning): Full assist, staff holds infant at breast  LATCH Score: 4   Lactation Tools Discussed/Used Tools: Pump;35F feeding tube / Syringe;Hands-free pumping top (Curved tip syringe used to attemp to get baby to take donor milk and mom's expressed milk.) Breast pump type: Double-Electric Breast Pump Pump Education: Setup, frequency, and cleaning;Milk Storage Reason for Pumping: Mom's request and baby not feeding well.  Interventions Interventions: Assisted with latch;Breast massage;Breast compression;DEBP;Education;Pace feeding  Discharge Pump: DEBP (Per mom she has a DEBP at home.)  Consult Status Consult Status: Follow-up Date: 09/24/21 Follow-up type: In-patient    Lysbeth Penner 09/24/2021, 6:31 PM

## 2021-09-24 NOTE — Discharge Summary (Signed)
Postpartum Discharge Summary  Date of Service updated- 5/30     Patient Name: April Schmidt DOB: 11-05-1990 MRN: [id number]  Date of admission: 09/24/2021 Delivery date:09/24/2021  Delivering provider: Christin Fudge  Date of discharge: 09/26/2021  Admitting diagnosis: IUGR (intrauterine growth restriction) affecting care of mother, third trimester, not applicable or unspecified fetus [O36.5930] Intrauterine pregnancy: [redacted]w[redacted]d    Secondary diagnosis:  Principal Problem:   IUGR (intrauterine growth restriction) affecting care of mother, third trimester, not applicable or unspecified fetus  Additional problems: none    Discharge diagnosis: Term Pregnancy Delivered                                              Post partum procedures: none Augmentation:  None Complications: None  Hospital course: Onset of Labor With Vaginal Delivery      31y.o. yo [id number] admitted in Latent Labor on 09/24/2021. Patient had an uncomplicated labor course as follows:  Membrane Rupture Time/Date: 6:40 AM ,09/24/2021   Delivery Method:VBAC, Spontaneous  Episiotomy: None  Lacerations:  None  Patient had an uncomplicated postpartum course.  She is ambulating, tolerating a regular diet, passing flatus, and urinating well. Patient is discharged home in stable condition on 09/26/21.  Newborn Data: Birth date:09/24/2021  Birth time:7:18 AM  Gender:Female  Living status:Living  Apgars:8 ,9  Weight:2590 g   Magnesium Sulfate received: No BMZ received: No Rhophylac:N/A MMR:N/A T-DaP: ordered Flu: N/A Transfusion:No  Physical exam  Vitals:   09/25/21 0535 09/25/21 1438 09/25/21 2029 09/26/21 0522  BP: 116/80 111/64 123/84 124/71  Pulse: 66 65 72 64  Resp: 16 17 19    Temp: 99 F (37.2 C) 97.9 F (36.6 C)    TempSrc: Oral Oral    SpO2:  99% 99% 100%  Weight:      Height:       General: alert, cooperative, and no distress Lochia: appropriate Uterine Fundus: firm Abdomen:  soft, large ventral hernia Incision: N/A DVT Evaluation: No evidence of DVT seen on physical exam. Labs: Lab Results  Component Value Date   WBC 7.6 09/24/2021   HGB 9.5 (L) 09/24/2021   HCT 31.2 (L) 09/24/2021   MCV 74.8 (L) 09/24/2021   PLT 229 09/24/2021       View : No data to display.         Edinburgh Score:    09/24/2021    9:15 AM  EdFlavia Shipperostnatal Depression Scale Screening Tool  I have been able to laugh and see the funny side of things. 0  I have looked forward with enjoyment to things. 0  I have blamed myself unnecessarily when things went wrong. 0  I have been anxious or worried for no good reason. 0  I have felt scared or panicky for no good reason. 0  Things have been getting on top of me. 0  I have been so unhappy that I have had difficulty sleeping. 0  I have felt sad or miserable. 0  I have been so unhappy that I have been crying. 0  The thought of harming myself has occurred to me. 0  Edinburgh Postnatal Depression Scale Total 0     After visit meds:  Allergies as of 09/26/2021   No Known Allergies      Medication List     TAKE these medications  acetaminophen 325 MG tablet Commonly known as: Tylenol Take 2 tablets (650 mg total) by mouth every 4 (four) hours as needed (for pain scale < 4).   albuterol 0.63 MG/3ML nebulizer solution Commonly known as: ACCUNEB Take 3 mLs (0.63 mg dose) by nebulization every 6 (six) hours as needed for Wheezing.   albuterol 108 (90 Base) MCG/ACT inhaler Commonly known as: VENTOLIN HFA Inhale into the lungs.   budesonide-formoterol 80-4.5 MCG/ACT inhaler Commonly known as: SYMBICORT Inhale into the lungs.   furosemide 20 MG tablet Commonly known as: LASIX Take 1 tablet (20 mg total) by mouth daily for 3 days.   ibuprofen 600 MG tablet Commonly known as: ADVIL Take 1 tablet (600 mg total) by mouth every 6 (six) hours.   multivitamin capsule Take 1 capsule by mouth daily.   vitamin C 100 MG  tablet Take by mouth.         Discharge home in stable condition Infant Feeding: Breast Infant Disposition:home with mother Discharge instruction: per After Visit Summary and Postpartum booklet. Activity: Advance as tolerated. Pelvic rest for 6 weeks.  Diet: routine diet Future Appointments: Future Appointments  Date Time Provider [place]  11/06/2021 10:15 AM Donnamae Jude, MD [facility] None   Follow up Visit:  [place] Follow up.   Specialty: Obstetrics and Gynecology Contact information: [address] [place] [address] [phone]                Message sent to [facility] by Dr. Damita Dunnings to schedule postpartum appointments.   Please schedule this patient for a In person postpartum visit in 6 weeks with the following provider: Any provider. Additional Postpartum F/U: None   Low risk pregnancy complicated by:  N/A Delivery mode:  VBAC, Spontaneous  Anticipated Birth Control:   declined   09/26/2021 Annalee Genta, DO

## 2021-09-24 NOTE — H&P (Signed)
Obstetric History and Physical  April Schmidt is a 31 y.o. [id number] with IUP at [redacted]w[redacted]d presenting for early labor & IUGR. Being followed by MFM for IUGR (EFW 2.5% on 5/25) - declined recommended induction. Presents for painful contractions & made minimal change in MAU. Fetal tracing in MAU is category 1 but not reactive. Patient agreeable to admission. Denies leaking of fluid or vaginal bleeding. Reports normal fetal movement.     Prenatal Course Source of Care: [facility]  with onset of care at 12 weeks Pregnancy complications or risks: Patient Active Problem List   Diagnosis Date Noted   GBS (group B Streptococcus carrier), +RV culture, currently pregnant 09/19/2021   Supervision of other normal pregnancy, antepartum 03/30/2021   Moderate persistent asthma without complication 02/07/2018   Ventral hernia without obstruction or gangrene 08/22/2017   Uterine contractions during pregnancy 08/22/2017   VBAC, delivered 08/22/2017   Anemia affecting pregnancy 07/31/2017   Uterine size date discrepancy pregnancy, third trimester 07/26/2017   Frequent headaches 07/26/2017   History of C-section 07/10/2017   She plans to breastfeed She undecided about postpartum contraception.  Prenatal labs and studies: ABO, Rh: A/Positive/-- (12/01 1100) Antibody: Negative (12/01 1100) Rubella: 17.10 (12/01 1100) RPR: Non Reactive (03/23 0841)  HBsAg: Negative (12/01 1100)  HIV: Non Reactive (03/23 0841)  LFY:BOFBPZWC/-- (05/16 1057) 2hr GTT:  not done Genetic screening normal Anatomy US normal  Prenatal Transfer Tool  Maternal Diabetes: not tested Genetic Screening: Normal Maternal Ultrasounds/Referrals: IUGR Fetal Ultrasounds or other Referrals:  None Maternal Substance Abuse:  No Significant Maternal Medications:  None Significant Maternal Lab Results: Group B Strep positive  Past Medical History:  Diagnosis Date   Medical history non-contributory     Past Surgical History:  Procedure  Laterality Date   CESAREAN SECTION     breech presentation    OB History  Gravida Para Term Preterm AB Living  3 2 2     2   SAB IAB Ectopic Multiple Live Births        0 2    # Outcome Date GA Lbr Len/2nd Weight Sex Delivery Anes PTL Lv  3 Current           2 Term 08/22/17 [redacted]w[redacted]d 06:12 / 02:27 2296 g M VBAC EPI  LIV  1 Term     M CS-Unspec   LIV    Social History   Socioeconomic History   Marital status: Single    Spouse name: Not on file   Number of children: Not on file   Years of education: Not on file   Highest education level: Not on file  Occupational History   Not on file  Tobacco Use   Smoking status: Never   Smokeless tobacco: Never  Vaping Use   Vaping Use: Never used  Substance and Sexual Activity   Alcohol use: No   Drug use: No   Sexual activity: Not Currently    Birth control/protection: None  Other Topics Concern   Not on file  Social History Narrative   Not on file   Social Determinants of Health   Financial Resource Strain: Not on file  Food Insecurity: Not on file  Transportation Needs: Not on file  Physical Activity: Not on file  Stress: Not on file  Social Connections: Not on file    Family History  Problem Relation Age of Onset   Hypertension Mother    Asthma Mother    Diabetes Maternal Grandmother    Diabetes Paternal  Grandmother     Medications Prior to Admission  Medication Sig Dispense Refill Last Dose   albuterol (ACCUNEB) 0.63 MG/3ML nebulizer solution Take 3 mLs (0.63 mg dose) by nebulization every 6 (six) hours as needed for Wheezing.      albuterol (VENTOLIN HFA) 108 (90 Base) MCG/ACT inhaler Inhale into the lungs.      Ascorbic Acid (VITAMIN C) 100 MG tablet Take by mouth.      budesonide-formoterol (SYMBICORT) 80-4.5 MCG/ACT inhaler Inhale into the lungs. (Patient not taking: Reported on 09/12/2021)      Multiple Vitamin (MULTIVITAMIN) capsule Take 1 capsule by mouth daily.       No Known Allergies  Review of  Systems: Negative except for what is mentioned in HPI.  Physical Exam: BP 132/84 (BP Location: Right Arm)   Pulse 81   Temp 98.1 F (36.7 C) (Oral)   Resp 17   Ht 5\' 5"  (1.651 m)   Wt 83 kg   LMP 01/11/2021   SpO2 99%   BMI 30.44 kg/m  CONSTITUTIONAL: Well-developed, well-nourished female in no acute distress.  HENT:  Normocephalic, atraumatic, External right and left ear normal. Oropharynx is clear and moist EYES: Conjunctivae and EOM are normal. Pupils are equal, round, and reactive to light. No scleral icterus.  NECK: Normal range of motion, supple, no masses SKIN: Skin is warm and dry. No rash noted. Not diaphoretic. No erythema. No pallor. NEUROLOGIC: Alert and oriented to person, place, and time. Normal reflexes, muscle tone coordination. No cranial nerve deficit noted. PSYCHIATRIC: Normal mood and affect. Normal behavior. Normal judgment and thought content. CARDIOVASCULAR: Normal heart rate noted, regular rhythm RESPIRATORY: Effort and breath sounds normal, no problems with respiration noted ABDOMEN: Soft, nontender, nondistended, gravid. MUSCULOSKELETAL: Normal range of motion. No edema and no tenderness. 2+ distal pulses.  Cervical Exam: Dilation: 3.5 Effacement (%): 80 Cervical Position: Posterior Station: -3 Presentation: Vertex Exam by:: [email or web address] NP   Presentation: cephalic FHT:  NST:  Baseline: 150 bpm, Variability: Good {> 6 bpm), Accelerations: 10x10, and Decelerations: Absent   Pertinent Labs/Studies:   No results found for this or any previous visit (from the past 24 hour(s)).  Assessment : April Schmidt is a 31 y.o. G3P2002 at [redacted]w[redacted]d being admitted for early labor  Plan: Labor: Expectant management vs augmentation Analgesia as needed. FWB: Reassuring fetal heart tracing.   GBS positive Delivery plan: Hopeful for successful VBAC  [redacted]w[redacted]d, NP

## 2021-09-25 MED ORDER — FUROSEMIDE 20 MG PO TABS
20.0000 mg | ORAL_TABLET | Freq: Every day | ORAL | Status: DC
  Filled 2021-09-25 (×2): qty 1

## 2021-09-25 NOTE — Progress Notes (Signed)
Post Partum Day 1 Subjective: no complaints, up ad lib, voiding, and tolerating PO  Objective: Blood pressure 116/80, pulse 66, temperature 99 F (37.2 C), temperature source Oral, resp. rate 16, height 5\' 5"  (1.651 m), weight 83 kg, last menstrual period 01/11/2021, SpO2 99 %, unknown if currently breastfeeding.  Physical Exam:  General: alert, cooperative, and appears stated age 31: appropriate Uterine Fundus: firm DVT Evaluation: No evidence of DVT seen on physical exam.  Recent Labs    09/24/21 0400  HGB 9.5*  HCT 31.2*    Assessment/Plan: Breastfeeding, Lactation consult, Circumcision prior to discharge, and Contraception undecided Desires early DC if baby can go Benefits of circumcision  Circumcised boys/men seem to have slightly lower rates of: ?Urinary tract infections ?Swelling of the opening at the tip of the penis ?HIV and other infections that you catch during sex, or transmit to a future partner(s) ?Cervical cancer in the women they have sex with Even so, in the 09/26/21, the risks of these problems are small - even in boys and men who have not been circumcised. Plus, boys and men who are not circumcised can reduce these extra risks by: ?Cleaning their penis well ?Using condoms during sex  Risks of circumcision  Risks include: ?Bleeding or infection from the surgery ?Damage to or amputation of the penis ?A chance that the doctor will cut off too much or not enough of the foreskin ?A chance that sex won't feel as good later in life Only about 1 out of every 200 circumcisions leads to problems. There is also a chance that your health insurance won't pay for circumcision. Desires circ    LOS: 1 day   [place] 09/25/2021, 10:08 AM

## 2021-09-25 NOTE — Lactation Note (Signed)
This note was copied from a baby's chart. Lactation Consultation Note  Patient Name: April Schmidt [id number] Date: 09/25/2021 Reason for consult: Follow-up assessment;Early term 37-38.6wks;Infant < 6lbs Age:28 hours   P3 mother whose infant is now 58 hours old.  This is an early term infant at 37+6 weeks weighing < 6 lbs.  Mother's current feeding preference is breast/donor breast milk.  Mother has been breast feeding and supplementing with donor breast milk.  Offered to assist with waking and latching "[name]" and mother receptive.  Removed baby's clothing and asked mother to demonstrate hand expression.  Colostrum drops fed to "[name]."  Assisted to latch in the football hold.  Reinforced the importance of obtaining a wide gape due to baby's small mouth and mother's large nipples.  "[name]" was able to initiate a suck and observed him feeding for a few minutes before pushing back off the breast.  Repeated attempts needed.  He latched easily but has difficulty sustaining a latch for more than a minute.  Continued to latch and re-latch for 10 minutes.  When he no longer showed an interest I suggested mother provide the donor breast milk.  Suggested she increase volume amounts to at least 20 or more mls/feeding as he desires.  Observed him feeding 10 mls  using the white Nfant nipple with a good burp before leaving the room.  Suggested mother call her RN/LC for the next feeding as needed.  She will continue to post pump.  She has not consistently been pumping every three hours but is trying to stay as close as possible to this time frame.  Currently she is obtaining approximately 10 mls at a session.  No pump review needed.  No support person present at this time.  RN updated.   Maternal Data Has patient been taught Hand Expression?: Yes Does the patient have breastfeeding experience prior to this delivery?: Yes How long did the patient breastfeed?: 9 months with her first child  Feeding Mother's  Current Feeding Choice: Breast Milk and Donor Milk Nipple Type: Slow - flow  LATCH Score Latch: Repeated attempts needed to sustain latch, nipple held in mouth throughout feeding, stimulation needed to elicit sucking reflex.  Audible Swallowing: None  Type of Nipple: Everted at rest and after stimulation  Comfort (Breast/Nipple): Soft / non-tender  Hold (Positioning): Assistance needed to correctly position infant at breast and maintain latch.  LATCH Score: 6   Lactation Tools Discussed/Used Tools: Pump;Flanges Flange Size: 24;27 Breast pump type: Double-Electric Breast Pump;Manual Pump Education: Setup, frequency, and cleaning (No review needed) Reason for Pumping: Breast stimulation for supplementation for early term < 6 lbs Pumping frequency: Every three hours  Interventions Interventions: Breast feeding basics reviewed;Assisted with latch;Skin to skin;Breast massage;Hand express;Breast compression;Hand pump;Expressed milk;Position options;Support pillows;Adjust position;DEBP;Education  Discharge Pump: Personal (Unsure of brand)  Consult Status Consult Status: Follow-up Date: 09/26/21 Follow-up type: In-patient    Little Ishikawa 09/25/2021, 6:46 PM

## 2021-09-26 ENCOUNTER — Encounter: Payer: Medicaid Other | Admitting: Advanced Practice Midwife

## 2021-09-26 MED ORDER — FUROSEMIDE 20 MG PO TABS
20.0000 mg | ORAL_TABLET | Freq: Every day | ORAL | 0 refills | Status: AC
Start: 2021-09-26 — End: 2021-09-29

## 2021-09-26 MED ORDER — IBUPROFEN 600 MG PO TABS: 600.0000 mg | ORAL_TABLET | Freq: Four times a day (QID) | ORAL | 0 refills | Status: DC

## 2021-09-26 MED ORDER — ACETAMINOPHEN 325 MG PO TABS: 650.0000 mg | ORAL_TABLET | ORAL | Status: AC | PRN

## 2021-09-26 NOTE — Progress Notes (Signed)
Pt set up on babyscripts.

## 2021-09-26 NOTE — Lactation Note (Signed)
This note was copied from a baby's chart. Lactation Consultation Note  Patient Name: April Schmidt [id number] Date: 09/26/2021 Reason for consult: Follow-up assessment;Mother's request;Difficult latch;Early term 37-38.6wks;Infant < 6lbs;Breastfeeding assistance;Nipple pain/trauma Age:29 hours  Infant recent circ, sleepy at the breast. Mom nipple trauma from previous shallow latch.  Infant able to latch in cross cradle prone but not sustain the latch at the time [facility] visit. Infant fed 26 ml of EBM in bottle wit Nfant white nipple.   Plan 1. To feed based on cues 8-12x 24hr period. Mom to offer breasts and note signs of milk transfer.  2. Mom to supplement with EBM ( getting 24-30 ml per pumping session) , Mom using Nfant white nipple 20-30 ml, aware to offer more if infant not latching.  3. DEBP q 3hrs for 20 min on maintenance using 27/30 flange.  All questions answered at the end of the visit.   Maternal Data Has patient been taught Hand Expression?: Yes  Feeding Mother's Current Feeding Choice: Breast Milk and Donor Milk Nipple Type: Nfant Standard Flow (white)  LATCH Score Latch: Repeated attempts needed to sustain latch, nipple held in mouth throughout feeding, stimulation needed to elicit sucking reflex.  Audible Swallowing: None  Type of Nipple: Everted at rest and after stimulation (Infant not opening wide enough to get more depth on the breast, nipple bruising from previous shallow latches. We tried cross cradle prone on his stomach, able to get deeper but would not sustain the latch.)  Comfort (Breast/Nipple): Filling, red/small blisters or bruises, mild/mod discomfort  Hold (Positioning): Assistance needed to correctly position infant at breast and maintain latch.  LATCH Score: 5   Lactation Tools Discussed/Used Tools: Pump;Flanges;Coconut oil Flange Size: 27;30 Breast pump type: Double-Electric Breast Pump Pump Education: Setup, frequency, and cleaning;Milk  Storage Reason for Pumping: increase stimuation Pumping frequency: every 3 hrs for 20 min on maintentance setting  Interventions Interventions: Breast feeding basics reviewed;Assisted with latch;Skin to skin;Breast massage;Hand express;Breast compression;Adjust position;Support pillows;Position options;Expressed milk;DEBP;Pace feeding;Education;LC Psychologist, educational;Infant Driven Feeding Algorithm education  Discharge Discharge Education: Engorgement and breast care;Warning signs for feeding baby;Outpatient recommendation;Outpatient Epic message sent Pump: DEBP;Personal  Consult Status Consult Status: Complete Date: 09/26/21    April Casten  Schmidt 09/26/2021, 12:18 PM

## 2021-09-27 ENCOUNTER — Encounter: Payer: Self-pay | Admitting: Family Medicine

## 2021-09-29 ENCOUNTER — Ambulatory Visit: Payer: Medicaid Other

## 2021-09-29 ENCOUNTER — Other Ambulatory Visit: Payer: Medicaid Other

## 2021-10-03 ENCOUNTER — Telehealth (HOSPITAL_COMMUNITY): Payer: Self-pay | Admitting: *Deleted

## 2021-10-03 NOTE — Telephone Encounter (Signed)
Left phone voicemail message.  Odis Hollingshead, RN 10-03-2021 at 3:36pm

## 2021-10-10 ENCOUNTER — Encounter: Payer: Medicaid Other | Admitting: Advanced Practice Midwife

## 2021-11-06 ENCOUNTER — Ambulatory Visit (INDEPENDENT_AMBULATORY_CARE_PROVIDER_SITE_OTHER): Payer: Medicaid Other | Admitting: Family Medicine

## 2021-11-06 ENCOUNTER — Encounter: Payer: Self-pay | Admitting: Family Medicine

## 2021-11-06 VITALS — BP 127/74 | HR 62 | Wt 164.0 lb

## 2021-11-06 DIAGNOSIS — K439 Ventral hernia without obstruction or gangrene: Secondary | ICD-10-CM | POA: Diagnosis not present

## 2021-11-06 NOTE — Progress Notes (Signed)
Post Partum Visit Note  April Schmidt is a 31 y.o. G62P3003 female who presents for a postpartum visit. She is 6 weeks postpartum following a VBAC.  I have fully reviewed the prenatal and intrapartum course. The delivery was at 37.6 gestational weeks.  Anesthesia: epidural. Postpartum course has been uneventful. Baby is doing well. Baby is feeding by breast. Bleeding no bleeding. Bowel function is normal. Bladder function is normal. Patient is sexually active. Contraception method is vasectomy. Postpartum depression screening: negative.   The pregnancy intention screening data noted above was reviewed. Potential methods of contraception were discussed. The patient elected to proceed with No data recorded.   Edinburgh Postnatal Depression Scale - 11/06/21 1014       Edinburgh Postnatal Depression Scale:  In the Past 7 Days   I have been able to laugh and see the funny side of things. 0    I have looked forward with enjoyment to things. 0    I have blamed myself unnecessarily when things went wrong. 0    I have been anxious or worried for no good reason. 0    I have felt scared or panicky for no good reason. 0    Things have been getting on top of me. 0    I have been so unhappy that I have had difficulty sleeping. 0    I have felt sad or miserable. 0    I have been so unhappy that I have been crying. 0    The thought of harming myself has occurred to me. 0    Edinburgh Postnatal Depression Scale Total 0             Health Maintenance Due  Topic Date Due   COVID-19 Vaccine (1) Never done   TETANUS/TDAP  Never done   PAP-Cervical Cytology Screening  05/06/2020   PAP SMEAR-Modifier  05/06/2020    The following portions of the patient's history were reviewed and updated as appropriate: allergies, current medications, past family history, past medical history, past social history, past surgical history, and problem list.  Review of Systems Pertinent items noted in HPI and  remainder of comprehensive ROS otherwise negative.  Objective:  BP 127/74   Pulse 62   Wt 164 lb (74.4 kg)   LMP 01/11/2021 (Exact Date)   Breastfeeding Yes   BMI 27.29 kg/m    General:  alert, cooperative, and appears stated age  Lungs: Normal effort  Heart:  regular rate and rhythm  Abdomen: soft, non-tender; bowel sounds normal; no masses,  no organomegaly        Assessment:    Normal postpartum exam.   Plan:   Essential components of care per ACOG recommendations:  1.  Mood and well being: Patient with negative depression screening today. Reviewed local resources for support.  - Patient tobacco use? No.   - hx of drug use? No.    2. Infant care and feeding:  -Patient currently breastmilk feeding? Yes. Reviewed importance of draining breast regularly to support lactation.  -Social determinants of health (SDOH) reviewed in EPIC. No concerns  3. Sexuality, contraception and birth spacing - Patient does not want a pregnancy in the next year.  Desired family size is 3 children.  - Reviewed reproductive life planning. Reviewed contraceptive methods based on pt preferences and effectiveness.  Patient desired No Method - No Contraceptive Precautions , partner to have vasectomy today.   - Discussed birth spacing of 18 months  4. Sleep  and fatigue -Encouraged family/partner/community support of 4 hrs of uninterrupted sleep to help with mood and fatigue  5. Physical Recovery  - Discussed patients delivery and complications. She describes her labor as good. - Patient had a Vaginal, no problems at delivery. Patient had no laceration. Perineal healing reviewed. Patient expressed understanding - Patient has urinary incontinence? No. - Patient is safe to resume physical and sexual activity  6.  Health Maintenance - HM due items addressed Yes - Last pap smear at [facility]  WNL in Care Everywhere 12/28/2020. Pap smear not done at today's visit.  -Breast Cancer screening indicated?  No.   7. Chronic Disease/Pregnancy Condition follow up:  Ventral Hernia repair is needed--f/u with gen surgery, referral placed.  - PCP follow up  Reva Bores, MD [facility]

## 2021-11-11 ENCOUNTER — Emergency Department (HOSPITAL_BASED_OUTPATIENT_CLINIC_OR_DEPARTMENT_OTHER): Payer: No Typology Code available for payment source

## 2021-11-11 ENCOUNTER — Emergency Department (HOSPITAL_BASED_OUTPATIENT_CLINIC_OR_DEPARTMENT_OTHER)
Admission: EM | Admit: 2021-11-11 | Discharge: 2021-11-11 | Disposition: A | Discharge status: Home / Self Care | Payer: No Typology Code available for payment source | Attending: Emergency Medicine | Admitting: Emergency Medicine

## 2021-11-11 ENCOUNTER — Other Ambulatory Visit: Payer: Self-pay

## 2021-11-11 ENCOUNTER — Emergency Department (HOSPITAL_BASED_OUTPATIENT_CLINIC_OR_DEPARTMENT_OTHER): Payer: Medicaid Other

## 2021-11-11 ENCOUNTER — Encounter (HOSPITAL_BASED_OUTPATIENT_CLINIC_OR_DEPARTMENT_OTHER): Payer: Self-pay | Admitting: Emergency Medicine

## 2021-11-11 DIAGNOSIS — S99911A Unspecified injury of right ankle, initial encounter: Secondary | ICD-10-CM | POA: Diagnosis present

## 2021-11-11 DIAGNOSIS — X501XXA Overexertion from prolonged static or awkward postures, initial encounter: Secondary | ICD-10-CM | POA: Insufficient documentation

## 2021-11-11 DIAGNOSIS — S82831A Other fracture of upper and lower end of right fibula, initial encounter for closed fracture: Secondary | ICD-10-CM | POA: Diagnosis not present

## 2021-11-11 MED ORDER — OXYCODONE-ACETAMINOPHEN 5-325 MG PO TABS: 1.0000 | ORAL_TABLET | Freq: Four times a day (QID) | ORAL | 0 refills | Status: DC | PRN

## 2021-11-11 NOTE — Discharge Instructions (Addendum)
You broke his left far part of your fibula.  Take the pain medicine as needed.  For now avoid walking on the foot and follow-up with orthopedics likely in the next week

## 2021-11-11 NOTE — ED Notes (Signed)
Patients rt ankle is swollen  Patient has limited movement

## 2021-11-11 NOTE — ED Notes (Signed)
Pt declined pain medication 

## 2021-11-11 NOTE — ED Triage Notes (Signed)
Pt arrives pov, to triage in wheelchair, endorses "rolling right ankle" when when going down "steep hill".  Reports hearing and feeling a "crack". Inability to bear wt. Swelling and possible deformity noted

## 2021-11-11 NOTE — ED Notes (Signed)
Sign pad was not working in room for discharge

## 2021-11-12 NOTE — ED Provider Notes (Signed)
[facility] EMERGENCY DEPARTMENT Provider Note   CSN: [id number] Arrival date & time: 11/11/21  1851     History  Chief Complaint  Patient presents with   Ankle Injury    April Schmidt is a 31 y.o. female.   Ankle Injury  Patient presents after fall.  Rolled ankle going down a steep hill.  Fell.  Pain on the ankle on the right foot.  No other injury.  States she felt a crack.  Skin is intact.     Home Medications Prior to Admission medications   Medication Sig Start Date End Date Taking? Authorizing Provider  oxyCODONE-acetaminophen (PERCOCET/ROXICET) 5-325 MG tablet Take 1 tablet by mouth every 6 (six) hours as needed for severe pain. 11/11/21  Yes Benjiman Core, MD  acetaminophen (TYLENOL) 325 MG tablet Take 2 tablets (650 mg total) by mouth every 4 (four) hours as needed (for pain scale < 4). Patient not taking: Reported on 11/06/2021 09/26/21   Myna Hidalgo, DO  albuterol (ACCUNEB) 0.63 MG/3ML nebulizer solution Take 3 mLs (0.63 mg dose) by nebulization every 6 (six) hours as needed for Wheezing. Patient not taking: Reported on 11/06/2021 09/03/19   [provider]  albuterol (VENTOLIN HFA) 108 (90 Base) MCG/ACT inhaler Inhale into the lungs. Patient not taking: Reported on 11/06/2021 01/13/20   [provider]  Ascorbic Acid (VITAMIN C) 100 MG tablet Take by mouth. Patient not taking: Reported on 11/06/2021    [provider]  budesonide-formoterol (SYMBICORT) 80-4.5 MCG/ACT inhaler Inhale into the lungs. Patient not taking: Reported on 09/12/2021 12/28/20   [provider]  furosemide (LASIX) 20 MG tablet Take 1 tablet (20 mg total) by mouth daily for 3 days. 09/26/21 09/29/21  Myna Hidalgo, DO  ibuprofen (ADVIL) 600 MG tablet Take 1 tablet (600 mg total) by mouth every 6 (six) hours. Patient not taking: Reported on 11/06/2021 09/26/21   Myna Hidalgo, DO  Multiple Vitamin (MULTIVITAMIN) capsule Take 1 capsule by mouth daily.     [provider]      Allergies    Patient has no known allergies.    Review of Systems   Review of Systems  Physical Exam Updated Vital Signs BP 123/84 (BP Location: Right Arm)   Pulse 71   Temp 98.7 F (37.1 C) (Oral)   Resp 18   LMP 01/11/2021 (Exact Date)   SpO2 98%  Physical Exam Vitals and nursing note reviewed.  Musculoskeletal:        General: Tenderness present.     Comments: Tenderness and swelling to right ankle.  Particularly laterally on the ankle.  Some edema.  Neurovascular intact in foot.  Decreased movement of the ankle.  No tenderness over proximal fibula.  Skin:    General: Skin is warm.  Neurological:     Mental Status: She is alert.     ED Results / Procedures / Treatments   Labs (all labs ordered are listed, but only abnormal results are displayed) Labs Reviewed - No data to display  EKG None  Radiology DG Ankle Complete Right  Result Date: 11/11/2021 CLINICAL DATA:  Fall and trauma to the right ankle. EXAM: RIGHT ANKLE - COMPLETE 3+ VIEW COMPARISON:  None Available. FINDINGS: There is a minimally displaced avulsion fracture of the tip of the lateral malleolus. No other acute fracture. There is no dislocation. The ankle mortise is intact. The bones are well mineralized. The soft tissue swelling over the lateral malleolus. No radiopaque foreign object or soft  tissue gas. IMPRESSION: Minimally displaced avulsion fracture of the tip of the lateral malleolus with overlying soft tissue swelling. Electronically Signed   By: Elgie Collard M.D.   On: 11/11/2021 19:35    Procedures Procedures    Medications Ordered in ED Medications - No data to display  ED Course/ Medical Decision Making/ A&P                           Medical Decision Making Amount and/or Complexity of Data Reviewed Radiology: ordered.  Risk Prescription drug management.   Patient with ankle injury.  Inversion.  Lateral and medial tenderness.  X-ray however  reviewed and independently interpreted and showed distal fibular fracture.  Will give cam walker with limited weightbearing.  Crutches given.  Follow-up with orthopedic surgery.  No other apparent injury        Final Clinical Impression(s) / ED Diagnoses Final diagnoses:  Closed fracture of distal end of right fibula, unspecified fracture morphology, initial encounter    Rx / DC Orders ED Discharge Orders          Ordered    oxyCODONE-acetaminophen (PERCOCET/ROXICET) 5-325 MG tablet  Every 6 hours PRN        11/11/21 2032              Benjiman Core, MD 11/12/21 1442

## 2022-06-14 IMAGING — US US MFM OB FOLLOW-UP
1 series · 13 of 28 positions shown · non-contrast
Comparison: none

[Series 1: us mfm ob follow-up · 60 acquisitions, 13 frames shown]
[im 3/60]
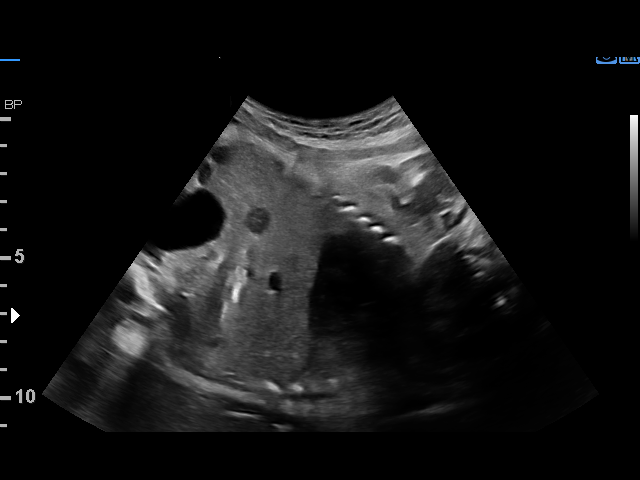
[im 7/60]
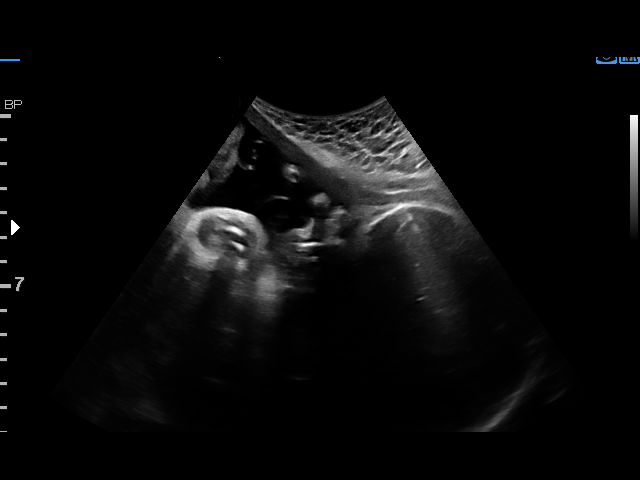
[im 11/60]
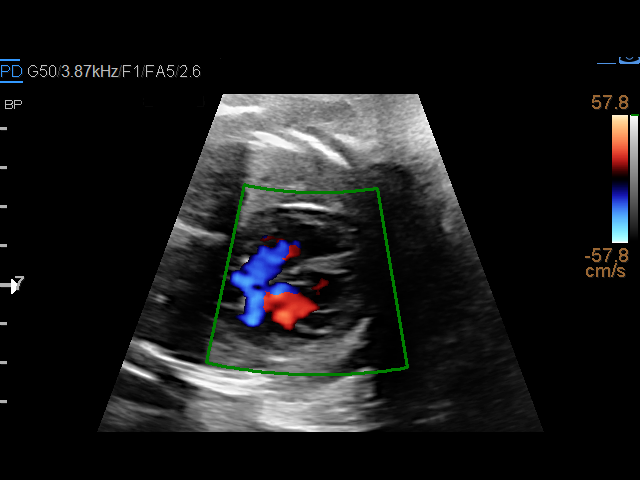
[im 16/60]
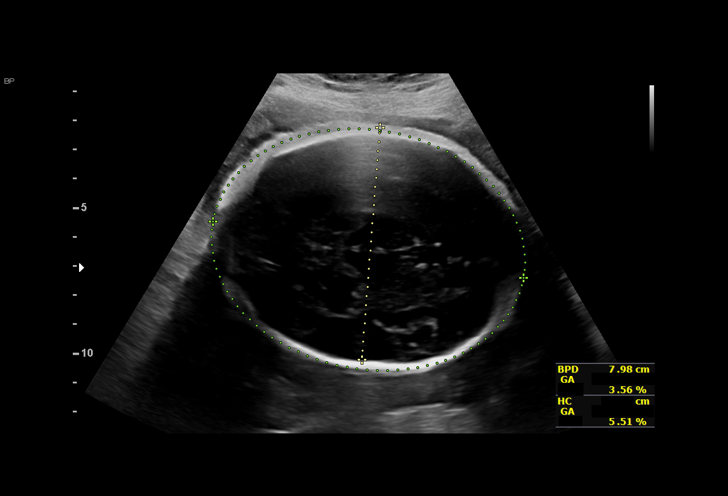
[im 20/60]
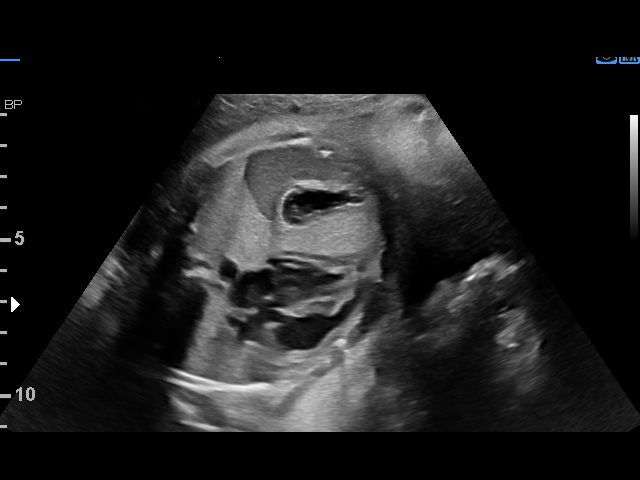
[im 25/60]
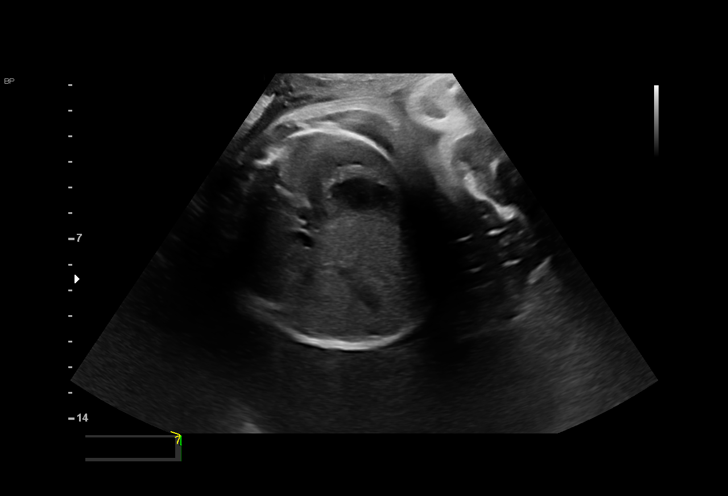
[im 31/60]
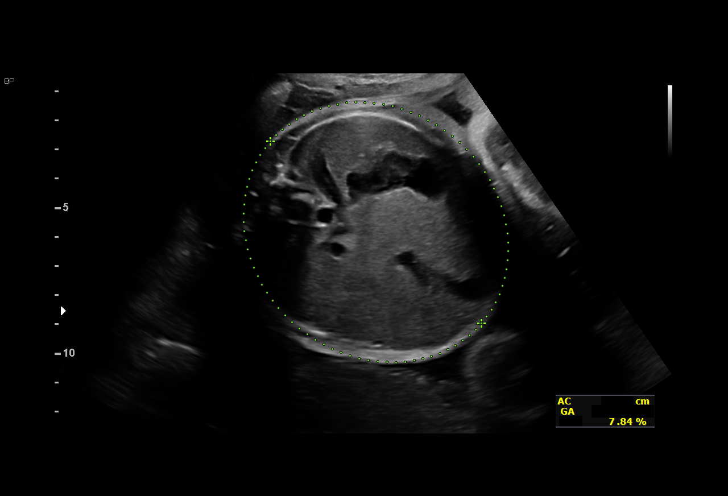
[im 35/60]
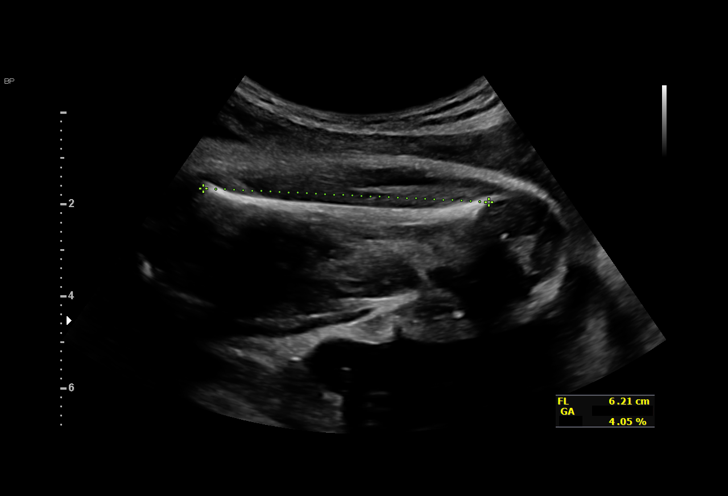
[im 40/60]
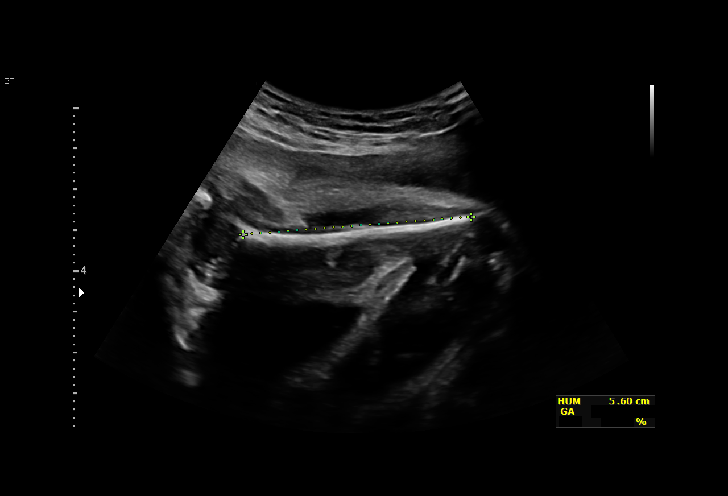
[im 44/60]
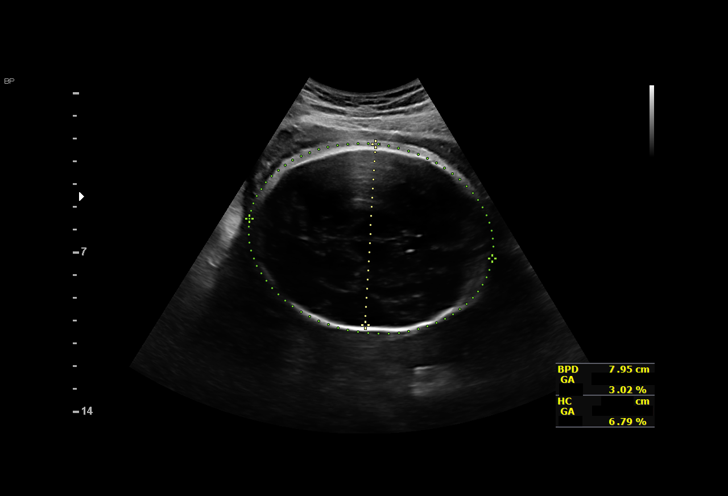
[im 49/60]
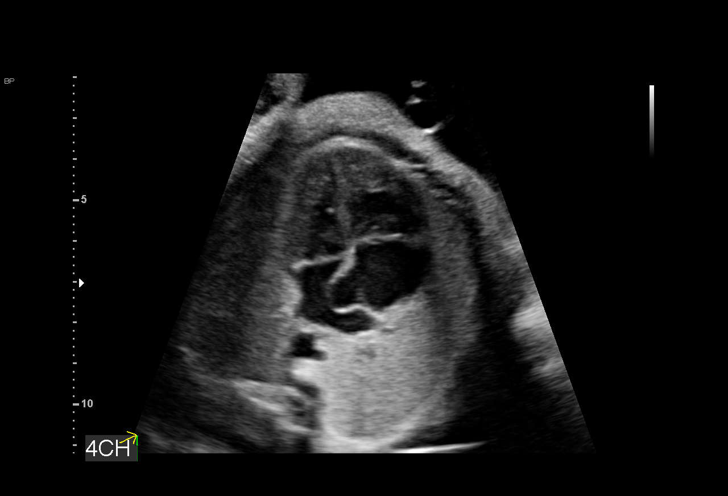
[im 53/60]
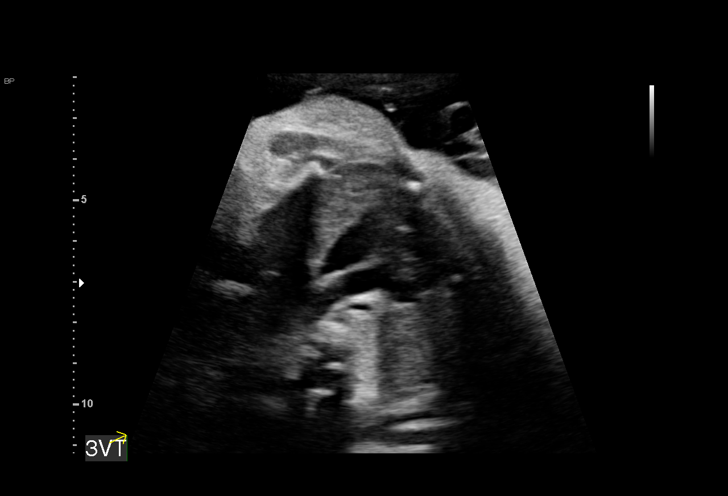
[im 57/60]
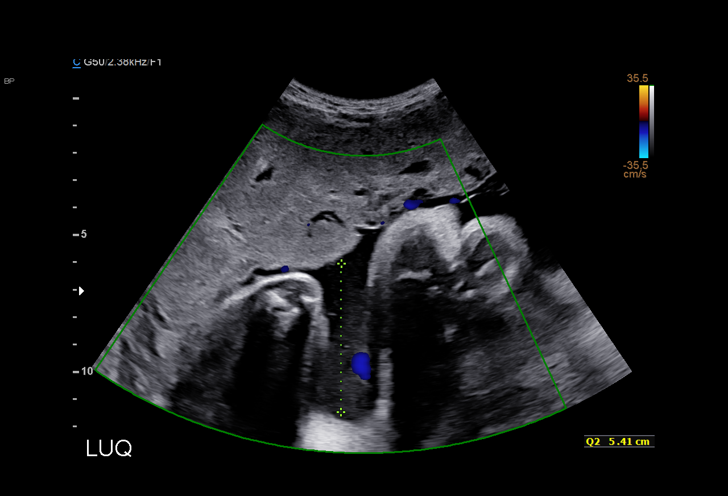

[13 of 28 positions shown; findings below may reference images not displayed]

Indications

 Echogenic intracardiac focus of the heart
 (EIF)
 Genetic carrier (Sickle Cell Trait, SMA
 carrier)
 History of cesarean delivery, currently
 pregnant (x1)
 LR NIPS, Neg AFP
 Anemia during pregnancy in third trimester
 34 weeks gestation of pregnancy
Fetal Evaluation

 Num Of Fetuses:         1
 Fetal Heart Rate(bpm):  169
 Cardiac Activity:       Observed
 Presentation:           Cephalic
 Placenta:               Fundal
 P. Cord Insertion:      Previously Visualized

 Amniotic Fluid
 AFI FV:      Within normal limits

 AFI Sum(cm)     %Tile       Largest Pocket(cm)
 14.41           51
 RUQ(cm)       RLQ(cm)       LUQ(cm)        LLQ(cm)

Biophysical Evaluation

 Amniotic F.V:   Pocket => 2 cm             F. Tone:        Observed
 F. Movement:    Observed                   Score:          [DATE]
 F. Breathing:   Observed
Biometry

 BPD:      79.6  mm     G. Age:  32w 0d        3.1  %    CI:        70.83   %    70 - 86
                                                         FL/HC:      20.4   %    19.4 -
 HC:      301.4  mm     G. Age:  33w 3d          6  %    HC/AC:      1.07        0.96 -
 AC:      281.8  mm     G. Age:  32w 2d          7  %    FL/BPD:     77.3   %    71 - 87
 FL:       61.5  mm     G. Age:  31w 6d        2.6  %    FL/AC:      21.8   %    20 - 24
 HUM:      56.1  mm     G. Age:  32w 5d         25  %

 LV:        5.2  mm

 Est. FW:    9747  gm      4 lb 4 oz      5  %
OB History

 Gravidity:    3
 Living:       2
Gestational Age

 LMP:           33w 0d        Date:  01/11/21                  EDD:   10/18/21
 U/S Today:     32w 3d                                        EDD:   10/22/21
 Best:          34w 2d     Det. By:  Early Ultrasound         EDD:   10/09/21
                                     (03/30/21)
Anatomy

 Cranium:               Appears normal         LVOT:                   Appears normal
 Cavum:                 Appears normal         Aortic Arch:            Previously seen
 Ventricles:            Appears normal         Ductal Arch:            Previously seen
 Choroid Plexus:        Previously seen        Diaphragm:              Appears normal
 Cerebellum:            Previously seen        Stomach:                Appears normal, left
                                                                       sided
 Posterior Fossa:       Previously seen        Abdomen:                Previously seen
 Nuchal Fold:           Previously seen        Abdominal Wall:         Previously seen
 Face:                  Orbits and profile     Cord Vessels:           Appears normal (3
                        previously seen                                vessel cord)
 Lips:                  Previously seen        Kidneys:                Appear normal
 Palate:                Previously seen        Bladder:                Appears normal
 Thoracic:              Appears normal         Spine:                  Previously seen
 Heart:                 Appears normal; EIF    Upper Extremities:      Previously seen
 RVOT:                  Not well visualized    Lower Extremities:      Previously seen

 Other:  3VV and 3VTV visualized. Fetus appears to be a male. Heels/feet,
         open hands/5th, 3VV previously visualized. Technically difficult due to
         advanced gestational age.
Doppler - Fetal Vessels
 Umbilical Artery
  S/D     %tile      RI    %tile      PI    %tile            ADFV    RDFV
  1.85        9    0.46      4.1    0.62        6               No      No

Cervix Uterus Adnexa

 Cervix
 Not visualized (advanced GA >92wks)

 Uterus
 Normal shape and size.

 Right Ovary
 Not visualized.

 Left Ovary
 Not visualized.
Impression
 On today's ultrasound, the estimated fetal weight is at the 5th
 percentile.  Abdominal circumference measurement is at the
 7th percentile.  Amniotic fluid is normal and good fetal activity
 seen.  Umbilical artery Doppler showed normal forward
 diastolic flow.  Antenatal testing is reassuring.  BPP [DATE].
 Some fetal anatomical structures could be completed and
 appears normal.
 xxxxxxxxxxxxxxxxxxxxxxxxxxxxxxxxxxxxxxxxxxxxxxx
 Consultation (see [REDACTED] )

 [facility] the [facility]  She returned for completion of fetal
 anatomy.  Patient has not had screening for gestational
 diabetes.

 Blood pressure today at her office is 121/61 mmHg.

 Fetal growth restriction
 I explained the finding of fetal growth restriction that is difficult
 to differentiate from a constitutionally small for gestational
 age fetus.
 I discussed the possible causes of fetal growth restriction
 including placental insufficiency (most common cause),
 chromosomal anomalies and fetal infections.  Patient did not
 give history of fever or rashes.
 Chromosomal anomalies seen late-onset fetal growth
 restriction are less likely.
 I discussed ultrasound protocol of monitoring fetal growth
 restriction.
 Timing of delivery: Since small for gestational age fetuses
 have a higher chance of having perinatal mortality and
 morbidity, delivery at 38 to 39 weeks gestation is reasonable.
 If, however, severe fetal growth restriction is seen with normal
 antenatal testing, we will recommend delivery at 37 weeks
 gestation.
 I emphasized the importance of weekly antenatal testing.

Recommendations

 -Weekly BPP and UA Doppler studies till delivery.
 -Fetal growth assessment in 3 weeks and recommendation
 on timing of delivery after next fetal growth assessment.
                Job, Md Obaidur

## 2022-12-24 ENCOUNTER — Ambulatory Visit: Payer: Medicaid Other | Admitting: Obstetrics and Gynecology

## 2023-08-24 ENCOUNTER — Emergency Department (HOSPITAL_BASED_OUTPATIENT_CLINIC_OR_DEPARTMENT_OTHER)
Admission: EM | Admit: 2023-08-24 | Discharge: 2023-08-25 | Disposition: A | Discharge status: Home / Self Care | Attending: Emergency Medicine | Admitting: Emergency Medicine

## 2023-08-24 ENCOUNTER — Other Ambulatory Visit: Payer: Self-pay

## 2023-08-24 ENCOUNTER — Encounter (HOSPITAL_BASED_OUTPATIENT_CLINIC_OR_DEPARTMENT_OTHER): Payer: Self-pay | Admitting: Emergency Medicine

## 2023-08-24 DIAGNOSIS — R1013 Epigastric pain: Secondary | ICD-10-CM | POA: Diagnosis not present

## 2023-08-24 DIAGNOSIS — K029 Dental caries, unspecified: Secondary | ICD-10-CM | POA: Insufficient documentation

## 2023-08-24 DIAGNOSIS — D509 Iron deficiency anemia, unspecified: Secondary | ICD-10-CM | POA: Insufficient documentation

## 2023-08-24 DIAGNOSIS — R1010 Upper abdominal pain, unspecified: Secondary | ICD-10-CM | POA: Diagnosis present

## 2023-08-24 LAB — COMPREHENSIVE METABOLIC PANEL WITH GFR
ALT: 13 U/L (ref 0–44)
AST: 17 U/L (ref 15–41)
Albumin: 4.4 g/dL (ref 3.5–5.0)
Alkaline Phosphatase: 55 U/L (ref 38–126)
Anion gap: 13 (ref 5–15)
BUN: 6 mg/dL (ref 6–20)
CO2: 21 mmol/L — ABNORMAL LOW (ref 22–32)
Calcium: 9.7 mg/dL (ref 8.9–10.3)
Chloride: 105 mmol/L (ref 98–111)
Creatinine, Ser: 1.03 mg/dL — ABNORMAL HIGH (ref 0.44–1.00)
GFR, Estimated: >60 mL/min (ref >60)
Glucose, Bld: 97 mg/dL (ref 70–99)
Potassium: 4.2 mmol/L (ref 3.5–5.1)
Sodium: 139 mmol/L (ref 135–145)
Total Bilirubin: 0.3 mg/dL (ref 0.0–1.2)
Total Protein: 7.8 g/dL (ref 6.5–8.1)

## 2023-08-24 LAB — CBC WITH DIFFERENTIAL/PLATELET
Abs Immature Granulocytes: 0.02 10*3/uL (ref 0.00–0.07)
Basophils Absolute: 0 10*3/uL (ref 0.0–0.1)
Basophils Relative: 0 %
Eosinophils Absolute: 0.3 10*3/uL (ref 0.0–0.5)
Eosinophils Relative: 6 %
HCT: 32.7 % — ABNORMAL LOW (ref 36.0–46.0)
Hemoglobin: 10.4 g/dL — ABNORMAL LOW (ref 12.0–15.0)
Immature Granulocytes: 0 %
Lymphocytes Relative: 43 %
Lymphs Abs: 2.3 10*3/uL (ref 0.7–4.0)
MCH: 23.2 pg — ABNORMAL LOW (ref 26.0–34.0)
MCHC: 31.8 g/dL (ref 30.0–36.0)
MCV: 73 fL — ABNORMAL LOW (ref 80.0–100.0)
Monocytes Absolute: 0.6 10*3/uL (ref 0.1–1.0)
Monocytes Relative: 11 %
Neutro Abs: 2.2 10*3/uL (ref 1.7–7.7)
Neutrophils Relative %: 40 %
Platelets: 265 10*3/uL (ref 150–400)
RBC: 4.48 MIL/uL (ref 3.87–5.11)
RDW: 17 % — ABNORMAL HIGH (ref 11.5–15.5)
WBC: 5.4 10*3/uL (ref 4.0–10.5)
nRBC: 0 % (ref 0.0–0.2)

## 2023-08-24 LAB — LIPASE, BLOOD: Lipase: 32 U/L (ref 11–51)

## 2023-08-24 NOTE — ED Triage Notes (Signed)
 Pt c/o middle upper abd pain that started today; reports pain is shooting up to RT shoulder; denies NVD

## 2023-08-25 LAB — URINALYSIS, ROUTINE W REFLEX MICROSCOPIC
Bilirubin Urine: NEGATIVE
Glucose, UA: NEGATIVE mg/dL
Ketones, ur: NEGATIVE mg/dL
Nitrite: NEGATIVE
Protein, ur: NEGATIVE mg/dL
Specific Gravity, Urine: 1.015 (ref 1.005–1.030)
pH: 5.5 (ref 5.0–8.0)

## 2023-08-25 LAB — URINALYSIS, MICROSCOPIC (REFLEX): RBC / HPF: >50 RBC/hpf (ref 0–5)

## 2023-08-25 LAB — PREGNANCY, URINE: Preg Test, Ur: NEGATIVE

## 2023-08-25 MED ORDER — PANTOPRAZOLE SODIUM 40 MG PO TBEC
40.0000 mg | DELAYED_RELEASE_TABLET | Freq: Once | ORAL | Status: AC
  Administered 2023-08-25: 40 mg via ORAL
  Filled 2023-08-25: qty 1

## 2023-08-25 MED ORDER — ALUM & MAG HYDROXIDE-SIMETH 200-200-20 MG/5ML PO SUSP
30.0000 mL | Freq: Once | ORAL | Status: AC
  Administered 2023-08-25: 30 mL via ORAL
  Filled 2023-08-25: qty 30

## 2023-08-25 MED ORDER — AMOXICILLIN 500 MG PO CAPS: 1000.0000 mg | ORAL_CAPSULE | Freq: Two times a day (BID) | ORAL | 0 refills | Status: AC

## 2023-08-25 MED ORDER — PANTOPRAZOLE SODIUM 40 MG PO TBEC: 40.0000 mg | DELAYED_RELEASE_TABLET | Freq: Every day | ORAL | 0 refills | Status: AC

## 2023-08-25 MED ORDER — AMOXICILLIN 500 MG PO CAPS
1000.0000 mg | ORAL_CAPSULE | Freq: Once | ORAL | Status: AC
  Administered 2023-08-25: 1000 mg via ORAL
  Filled 2023-08-25: qty 2

## 2023-08-25 MED ORDER — CELECOXIB 100 MG PO CAPS
100.0000 mg | ORAL_CAPSULE | Freq: Two times a day (BID) | ORAL | 0 refills | Status: AC
Start: 2023-08-25 — End: ?

## 2023-08-25 NOTE — ED Provider Notes (Signed)
 [place] [facility] Provider Note   CSN: [id number] Arrival date & time: 08/24/23  2039     History  Chief Complaint  Patient presents with   Abdominal Pain    April Schmidt is a 33 y.o. female.  The history is provided by the patient.  Abdominal Pain She comes in because of abdominal pain today which is in the upper abdomen with some radiation to the back into the right suprascapular area.  There is no associated nausea or vomiting.  Of note, she has been taking naproxen for the last week because of a tooth ache, and pain got worse when she took her dose of naproxen today.  Pain was not affected by eating.  She is scheduled to see her dentist in 4 days.   Home Medications Prior to Admission medications   Medication Sig Start Date End Date Taking? Authorizing Provider  amoxicillin (AMOXIL) 500 MG capsule Take 2 capsules (1,000 mg total) by mouth 2 (two) times daily. 08/25/23  Yes Alissa April, MD  celecoxib (CELEBREX) 100 MG capsule Take 1 capsule (100 mg total) by mouth 2 (two) times daily. 08/25/23  Yes Alissa April, MD  pantoprazole (PROTONIX) 40 MG tablet Take 1 tablet (40 mg total) by mouth daily. 08/25/23  Yes Alissa April, MD  acetaminophen  (TYLENOL ) 325 MG tablet Take 2 tablets (650 mg total) by mouth every 4 (four) hours as needed (for pain scale < 4). Patient not taking: Reported on 11/06/2021 09/26/21   Ozan, Jennifer, DO  albuterol (ACCUNEB) 0.63 MG/3ML nebulizer solution Take 3 mLs (0.63 mg dose) by nebulization every 6 (six) hours as needed for Wheezing. Patient not taking: Reported on 11/06/2021 09/03/19   [provider]  albuterol (VENTOLIN HFA) 108 (90 Base) MCG/ACT inhaler Inhale into the lungs. Patient not taking: Reported on 11/06/2021 01/13/20   [provider]  Ascorbic Acid (VITAMIN C) 100 MG tablet Take by mouth. Patient not taking: Reported on 11/06/2021    [provider]  budesonide-formoterol  (SYMBICORT) 80-4.5 MCG/ACT inhaler Inhale into the lungs. Patient not taking: Reported on 09/12/2021 12/28/20   [provider]  furosemide  (LASIX ) 20 MG tablet Take 1 tablet (20 mg total) by mouth daily for 3 days. 09/26/21 09/29/21  Ozan, Jennifer, DO  Multiple Vitamin (MULTIVITAMIN) capsule Take 1 capsule by mouth daily.    [provider]      Allergies    Patient has no known allergies.    Review of Systems   Review of Systems  Gastrointestinal:  Positive for abdominal pain.  All other systems reviewed and are negative.   Physical Exam Updated Vital Signs BP (!) 127/91 (BP Location: Right Arm)   Pulse (!) 59   Temp 97.9 F (36.6 C) (Oral)   Resp 16   Ht 5\' 5"  (1.651 m)   Wt 68 kg   SpO2 100%   Breastfeeding No   BMI 24.96 kg/m  Physical Exam Vitals and nursing note reviewed.   33 year old female, resting comfortably and in no acute distress. Vital signs are normal. Oxygen saturation is 100%, which is normal. Head is normocephalic and atraumatic. PERRLA, EOMI. Oropharynx is clear.  Tooth #31 has caries and is tender to percussion. Neck is nontender and supple without adenopathy. Back is nontender and there is no CVA tenderness. Lungs are clear without rales, wheezes, or rhonchi. Chest is nontender. Heart has regular rate and rhythm without murmur. Abdomen is soft, flat, nontender. Neurologic:  Mental status is normal, moves all extremities equally.  ED Results / Procedures / Treatments   Labs (all labs ordered are listed, but only abnormal results are displayed) Labs Reviewed  COMPREHENSIVE METABOLIC PANEL WITH GFR - Abnormal; Notable for the following components:      Result Value   CO2 21 (*)    Creatinine, Ser 1.03 (*)    All other components within normal limits  URINALYSIS, ROUTINE W REFLEX MICROSCOPIC - Abnormal; Notable for the following components:   APPearance HAZY (*)    Hgb urine dipstick LARGE (*)    Leukocytes,Ua SMALL (*)    All  other components within normal limits  CBC WITH DIFFERENTIAL/PLATELET - Abnormal; Notable for the following components:   Hemoglobin 10.4 (*)    HCT 32.7 (*)    MCV 73.0 (*)    MCH 23.2 (*)    RDW 17.0 (*)    All other components within normal limits  URINALYSIS, MICROSCOPIC (REFLEX) - Abnormal; Notable for the following components:   Bacteria, UA RARE (*)    All other components within normal limits  LIPASE, BLOOD  PREGNANCY, URINE    EKG None  Radiology No results found.  Procedures Procedures    Medications Ordered in ED Medications  alum & mag hydroxide-simeth (MAALOX/MYLANTA) 200-200-20 MG/5ML suspension 30 mL (30 mLs Oral Given 08/25/23 0034)  amoxicillin (AMOXIL) capsule 1,000 mg (1,000 mg Oral Given 08/25/23 0110)  pantoprazole (PROTONIX) EC tablet 40 mg (40 mg Oral Given 08/25/23 0110)    ED Course/ Medical Decision Making/ A&P                                 Medical Decision Making Amount and/or Complexity of Data Reviewed Labs: ordered.  Risk OTC drugs. Prescription drug management.   Abdominal pain in the setting of NSAID use concerning for gastritis versus ulcer.  I have low index of suspicion for pancreatitis, cholecystitis, diverticulitis.  I have reviewed her laboratory test, my interpretation is stable anemia, normal comprehensive metabolic panel, normal lipase, not pregnant.  I have reviewed her past records, and note office visit on 08/14/2023 for dental pain with prescription given for naproxen 500 mg twice daily.  I have ordered a therapeutic trial of an antacid..  She did get some relief with antacid.  I have ordered a dose of pantoprazole.  I am discharging her with a prescription for pantoprazole, and I have advised her to stop taking naproxen.  She is concerned that she still needs something for pain.  I have ordered celecoxib to take its place but advised her that it can still irritate her stomach.  I have advised using acetaminophen  to supplement  for pain relief and I am also giving her a prescription for amoxicillin to treat her dental caries.  She is to keep her appointment with her dentist.  Final Clinical Impression(s) / ED Diagnoses Final diagnoses:  Epigastric pain  Microcytic anemia  Pain due to dental caries    Rx / DC Orders ED Discharge Orders          Ordered    celecoxib (CELEBREX) 100 MG capsule  2 times daily        08/25/23 0108    amoxicillin (AMOXIL) 500 MG capsule  2 times daily        08/25/23 0108    pantoprazole (PROTONIX) 40 MG tablet  Daily  08/25/23 1610              Alissa April, MD 08/25/23 [id number]

## 2023-08-25 NOTE — Discharge Instructions (Addendum)
 Stop taking naproxen.  It is irritating your stomach.  In its place, I have ordered celecoxib.  Be aware that this can still upset your stomach.  I recommend that you take it with food.  If you need additional pain relief, add acetaminophen .  You may take antacids as needed.  Please keep the appointment with your dentist.  The treatment that you are getting is to try to help symptoms but is not getting at the underlying problem, which is the cavities in your tooth.
